# Patient Record
Sex: Male | Born: 1965 | Race: Black or African American | Hispanic: No | Marital: Single | Smoking: Never smoker
Health system: Southern US, Community
[De-identification: ages and names within clinical notes are randomized; demographics above are authoritative.]

## PROBLEM LIST (undated history)

## (undated) DIAGNOSIS — M2340 Loose body in knee, unspecified knee: Secondary | ICD-10-CM

## (undated) HISTORY — PX: OTHER SURGICAL HISTORY: SHX169

---

## 1998-12-01 ENCOUNTER — Emergency Department (HOSPITAL_COMMUNITY): Admission: EM | Admit: 1998-12-01 | Discharge: 1998-12-01 | Payer: Self-pay | Admitting: Emergency Medicine

## 1998-12-03 ENCOUNTER — Emergency Department (HOSPITAL_COMMUNITY): Admission: EM | Admit: 1998-12-03 | Discharge: 1998-12-03 | Payer: Self-pay | Admitting: *Deleted

## 2000-08-19 ENCOUNTER — Encounter: Admission: RE | Admit: 2000-08-19 | Discharge: 2000-08-19 | Payer: Self-pay | Admitting: General Practice

## 2000-08-19 ENCOUNTER — Encounter: Payer: Self-pay | Admitting: General Practice

## 2000-08-29 ENCOUNTER — Ambulatory Visit (HOSPITAL_BASED_OUTPATIENT_CLINIC_OR_DEPARTMENT_OTHER): Admission: RE | Admit: 2000-08-29 | Discharge: 2000-08-29 | Payer: Self-pay | Admitting: Ophthalmology

## 2001-05-28 ENCOUNTER — Encounter: Payer: Self-pay | Admitting: *Deleted

## 2001-05-28 ENCOUNTER — Emergency Department (HOSPITAL_COMMUNITY): Admission: EM | Admit: 2001-05-28 | Discharge: 2001-05-28 | Payer: Self-pay

## 2001-05-28 ENCOUNTER — Encounter: Payer: Self-pay | Admitting: Emergency Medicine

## 2003-01-25 ENCOUNTER — Encounter: Admission: RE | Admit: 2003-01-25 | Discharge: 2003-01-25 | Payer: Self-pay | Admitting: Specialist

## 2003-06-17 ENCOUNTER — Ambulatory Visit (HOSPITAL_COMMUNITY): Admission: RE | Admit: 2003-06-17 | Discharge: 2003-06-17 | Payer: Self-pay | Admitting: Ophthalmology

## 2005-06-15 ENCOUNTER — Ambulatory Visit (HOSPITAL_COMMUNITY): Admission: RE | Admit: 2005-06-15 | Discharge: 2005-06-15 | Payer: Self-pay | Admitting: Ophthalmology

## 2012-07-15 ENCOUNTER — Other Ambulatory Visit: Payer: Self-pay | Admitting: Orthopedic Surgery

## 2012-07-15 MED ORDER — DEXAMETHASONE SODIUM PHOSPHATE 10 MG/ML IJ SOLN: 10.0000 mg | Freq: Once | INTRAMUSCULAR | Status: DC

## 2012-07-15 NOTE — Progress Notes (Signed)
Preoperative surgical orders have been place into the [facility] hospital system for Kyrel [name] on 07/15/2012, 5:32 PM  by Patrica Duel for surgery on 07/28/2012.  Preop Knee Scope orders including IV Tylenol and IV Decadron as long as there are no contraindications to the above medications. Avel Peace, PA-C

## 2012-07-21 ENCOUNTER — Encounter (HOSPITAL_COMMUNITY): Payer: Self-pay | Admitting: Pharmacy Technician

## 2012-07-23 ENCOUNTER — Encounter (HOSPITAL_COMMUNITY): Payer: Self-pay

## 2012-07-23 ENCOUNTER — Encounter (HOSPITAL_COMMUNITY)
Admission: RE | Admit: 2012-07-23 | Discharge: 2012-07-23 | Disposition: A | Discharge status: Home / Self Care | Payer: BC Managed Care – PPO | Source: Ambulatory Visit | Attending: Orthopedic Surgery | Admitting: Orthopedic Surgery

## 2012-07-23 HISTORY — DX: Loose body in knee, unspecified knee: M23.40

## 2012-07-23 LAB — CBC
MCHC: 35.1 g/dL (ref 30.0–36.0)
Platelets: 250 10*3/uL (ref 150–400)
RDW: 13 % (ref 11.5–15.5)

## 2012-07-23 LAB — SURGICAL PCR SCREEN
MRSA, PCR: NEGATIVE
Staphylococcus aureus: NEGATIVE

## 2012-07-23 NOTE — Patient Instructions (Signed)
Javier Weaver  07/23/2012                           YOUR PROCEDURE IS SCHEDULED ON:  07/28/12               PLEASE REPORT TO SHORT STAY CENTER AT :  12:00 PM               CALL THIS NUMBER IF ANY PROBLEMS THE DAY OF SURGERY :               [phone]    NONE                  REMEMBER:   Do not eat food or drink liquids AFTER MIDNIGHT  May have clear liquids UNTIL 6 HOURS BEFORE SURGERY (9:00 AM)  Clear liquids include soda, tea, black coffee, apple or grape juice, broth.  Take these medicines the morning of surgery with A SIP OF WATER:  NONE   Do not wear jewelry, make-up   Do not wear lotions, powders, or perfumes.   Do not shave legs or underarms 12 hrs. before surgery (men may shave face)  Do not bring valuables to the hospital.  Contacts, dentures or bridgework may not be worn into surgery.  Leave suitcase in the car. After surgery it may be brought to your room.  For patients admitted to the hospital more than one night, checkout time is 11:00                          The day of discharge.   Patients discharged the day of surgery will not be allowed to drive home                             If going home same day of surgery, must have someone stay with you first                           24 hrs at home and arrange for some one to drive you home from hospital.    Special Instructions:   Please read over the following fact sheets that you were given:               1. MRSA  INFORMATION                      2. [place] PREPARING FOR SURGERY SHEET               3. INCENTIVE SPIROMETER                                                X_____________________________________________________________________        Failure to follow these instructions may result in cancellation of your surgery

## 2012-07-28 ENCOUNTER — Encounter (HOSPITAL_COMMUNITY)
Admission: RE | Disposition: A | Discharge status: Home / Self Care | Payer: Self-pay | Source: Ambulatory Visit | Attending: Orthopedic Surgery

## 2012-07-28 ENCOUNTER — Encounter (HOSPITAL_COMMUNITY): Payer: Self-pay | Admitting: Anesthesiology

## 2012-07-28 ENCOUNTER — Ambulatory Visit (HOSPITAL_COMMUNITY): Payer: BC Managed Care – PPO | Admitting: Anesthesiology

## 2012-07-28 ENCOUNTER — Encounter (HOSPITAL_COMMUNITY): Payer: Self-pay | Admitting: *Deleted

## 2012-07-28 ENCOUNTER — Ambulatory Visit (HOSPITAL_COMMUNITY)
Admission: RE | Admit: 2012-07-28 | Discharge: 2012-07-28 | Disposition: A | Discharge status: Home / Self Care | Payer: BC Managed Care – PPO | Source: Ambulatory Visit | Attending: Orthopedic Surgery | Admitting: Orthopedic Surgery

## 2012-07-28 DIAGNOSIS — M12569 Traumatic arthropathy, unspecified knee: Secondary | ICD-10-CM | POA: Insufficient documentation

## 2012-07-28 DIAGNOSIS — M2340 Loose body in knee, unspecified knee: Secondary | ICD-10-CM | POA: Insufficient documentation

## 2012-07-28 DIAGNOSIS — M1710 Unilateral primary osteoarthritis, unspecified knee: Secondary | ICD-10-CM

## 2012-07-28 DIAGNOSIS — Z01812 Encounter for preprocedural laboratory examination: Secondary | ICD-10-CM | POA: Insufficient documentation

## 2012-07-28 DIAGNOSIS — M179 Osteoarthritis of knee, unspecified: Secondary | ICD-10-CM | POA: Diagnosis present

## 2012-07-28 HISTORY — PX: KNEE ARTHROTOMY: SHX5881

## 2012-07-28 SURGERY — ARTHROTOMY, KNEE
Anesthesia: General | Site: Knee | Laterality: Left | Wound class: Clean

## 2012-07-28 MED ORDER — ACETAMINOPHEN 10 MG/ML IV SOLN: 1000.0000 mg | Freq: Once | INTRAVENOUS | Status: DC

## 2012-07-28 MED ORDER — LIDOCAINE HCL (CARDIAC) 20 MG/ML IV SOLN
INTRAVENOUS | Status: DC | PRN
  Administered 2012-07-28: 50 mg via INTRAVENOUS

## 2012-07-28 MED ORDER — OXYCODONE HCL 5 MG PO TABS: 5.0000 mg | ORAL_TABLET | ORAL | Status: DC | PRN

## 2012-07-28 MED ORDER — 0.9 % SODIUM CHLORIDE (POUR BTL) OPTIME
TOPICAL | Status: DC | PRN
  Administered 2012-07-28: 200 mL

## 2012-07-28 MED ORDER — ACETAMINOPHEN 10 MG/ML IV SOLN: 1000.0000 mg | Freq: Once | INTRAVENOUS | Status: DC | PRN

## 2012-07-28 MED ORDER — PROMETHAZINE HCL 25 MG/ML IJ SOLN: 6.2500 mg | INTRAMUSCULAR | Status: DC | PRN

## 2012-07-28 MED ORDER — OXYCODONE HCL 5 MG PO TABS
5.0000 mg | ORAL_TABLET | Freq: Once | ORAL | Status: AC | PRN
  Administered 2012-07-28: 5 mg via ORAL

## 2012-07-28 MED ORDER — HYDROMORPHONE HCL PF 1 MG/ML IJ SOLN
INTRAMUSCULAR | Status: AC
  Filled 2012-07-28: qty 1

## 2012-07-28 MED ORDER — HYDROMORPHONE HCL PF 1 MG/ML IJ SOLN
0.2500 mg | INTRAMUSCULAR | Status: DC | PRN
  Administered 2012-07-28 (×3): 0.5 mg via INTRAVENOUS

## 2012-07-28 MED ORDER — PROPOFOL 10 MG/ML IV BOLUS
INTRAVENOUS | Status: DC | PRN
  Administered 2012-07-28: 180 mg via INTRAVENOUS

## 2012-07-28 MED ORDER — BUPIVACAINE HCL (PF) 0.5 % IJ SOLN
INTRAMUSCULAR | Status: DC | PRN
  Administered 2012-07-28: 30 mL

## 2012-07-28 MED ORDER — MIDAZOLAM HCL 5 MG/5ML IJ SOLN
INTRAMUSCULAR | Status: DC | PRN
  Administered 2012-07-28: 2 mg via INTRAVENOUS

## 2012-07-28 MED ORDER — BUPIVACAINE HCL (PF) 0.25 % IJ SOLN
INTRAMUSCULAR | Status: AC
  Filled 2012-07-28: qty 30

## 2012-07-28 MED ORDER — ONDANSETRON HCL 4 MG/2ML IJ SOLN
INTRAMUSCULAR | Status: DC | PRN
  Administered 2012-07-28: 4 mg via INTRAVENOUS

## 2012-07-28 MED ORDER — OXYCODONE HCL 5 MG PO TABS
ORAL_TABLET | ORAL | Status: AC
  Filled 2012-07-28: qty 1

## 2012-07-28 MED ORDER — LACTATED RINGERS IV SOLN
INTRAVENOUS | Status: DC
  Administered 2012-07-28: 17:00:00 via INTRAVENOUS
  Administered 2012-07-28: 1000 mL via INTRAVENOUS

## 2012-07-28 MED ORDER — FENTANYL CITRATE 0.05 MG/ML IJ SOLN
INTRAMUSCULAR | Status: DC | PRN
  Administered 2012-07-28: 50 ug via INTRAVENOUS
  Administered 2012-07-28 (×2): 25 ug via INTRAVENOUS

## 2012-07-28 MED ORDER — MEPERIDINE HCL 50 MG/ML IJ SOLN: 6.2500 mg | INTRAMUSCULAR | Status: DC | PRN

## 2012-07-28 MED ORDER — SODIUM CHLORIDE 0.9 % IV SOLN: INTRAVENOUS | Status: DC

## 2012-07-28 MED ORDER — CEFAZOLIN SODIUM-DEXTROSE 2-3 GM-% IV SOLR
INTRAVENOUS | Status: AC
  Filled 2012-07-28: qty 50

## 2012-07-28 MED ORDER — OXYCODONE HCL 5 MG/5ML PO SOLN
5.0000 mg | Freq: Once | ORAL | Status: AC | PRN
  Filled 2012-07-28: qty 5

## 2012-07-28 MED ORDER — CEFAZOLIN SODIUM-DEXTROSE 2-3 GM-% IV SOLR
2.0000 g | INTRAVENOUS | Status: AC
  Administered 2012-07-28: 2 g via INTRAVENOUS

## 2012-07-28 MED ORDER — CHLORHEXIDINE GLUCONATE 4 % EX LIQD: 60.0000 mL | Freq: Once | CUTANEOUS | Status: DC

## 2012-07-28 SURGICAL SUPPLY — 37 items
BAG SPEC THK2 15X12 ZIP CLS (MISCELLANEOUS) ×1
BAG ZIPLOCK 12X15 (MISCELLANEOUS) ×2 IMPLANT
BANDAGE ELASTIC 6 VELCRO ST LF (GAUZE/BANDAGES/DRESSINGS) ×2 IMPLANT
BANDAGE ESMARK 6X9 LF (GAUZE/BANDAGES/DRESSINGS) ×1 IMPLANT
BANDAGE GAUZE ELAST BULKY 4 IN (GAUZE/BANDAGES/DRESSINGS) ×2 IMPLANT
BNDG CMPR 9X6 STRL LF SNTH (GAUZE/BANDAGES/DRESSINGS) ×1
BNDG ESMARK 6X9 LF (GAUZE/BANDAGES/DRESSINGS) ×2
CLOTH BEACON ORANGE TIMEOUT ST (SAFETY) ×2 IMPLANT
CUFF TOURN SGL QUICK 34 (TOURNIQUET CUFF) ×2
CUFF TRNQT CYL 34X4X40X1 (TOURNIQUET CUFF) IMPLANT
DRAPE EXTREMITY T 121X128X90 (DRAPE) ×2 IMPLANT
DRAPE U-SHAPE 47X51 STRL (DRAPES) ×2 IMPLANT
DRSG ADAPTIC 3X8 NADH LF (GAUZE/BANDAGES/DRESSINGS) ×2 IMPLANT
DRSG PAD ABDOMINAL 8X10 ST (GAUZE/BANDAGES/DRESSINGS) ×4 IMPLANT
DURAPREP 26ML APPLICATOR (WOUND CARE) ×2 IMPLANT
ELECT REM PT RETURN 9FT ADLT (ELECTROSURGICAL) ×2
ELECTRODE REM PT RTRN 9FT ADLT (ELECTROSURGICAL) ×1 IMPLANT
GLOVE BIO SURGEON STRL SZ7.5 (GLOVE) ×2 IMPLANT
GLOVE BIO SURGEON STRL SZ8 (GLOVE) ×2 IMPLANT
GLOVE BIOGEL PI IND STRL 8 (GLOVE) ×2 IMPLANT
GLOVE BIOGEL PI INDICATOR 8 (GLOVE) ×2
GOWN STRL NON-REIN LRG LVL3 (GOWN DISPOSABLE) ×2 IMPLANT
GOWN STRL REIN XL XLG (GOWN DISPOSABLE) ×2 IMPLANT
KIT BASIN OR (CUSTOM PROCEDURE TRAY) ×2 IMPLANT
MANIFOLD NEPTUNE II (INSTRUMENTS) ×2 IMPLANT
PACK TOTAL JOINT (CUSTOM PROCEDURE TRAY) ×2 IMPLANT
PAD CAST 4YDX4 CTTN HI CHSV (CAST SUPPLIES) ×1 IMPLANT
PADDING CAST COTTON 4X4 STRL (CAST SUPPLIES) ×2
PADDING CAST COTTON 6X4 STRL (CAST SUPPLIES) ×1 IMPLANT
POSITIONER SURGICAL ARM (MISCELLANEOUS) ×2 IMPLANT
SPONGE GAUZE 4X4 12PLY (GAUZE/BANDAGES/DRESSINGS) ×2 IMPLANT
SUT MNCRL AB 4-0 PS2 18 (SUTURE) ×2 IMPLANT
SUT PDS AB 1 CT1 27 (SUTURE) ×1 IMPLANT
SUT VIC AB 2-0 CT1 27 (SUTURE) ×4
SUT VIC AB 2-0 CT1 TAPERPNT 27 (SUTURE) ×2 IMPLANT
SUT VLOC 180 0 24IN GS25 (SUTURE) ×1 IMPLANT
TOWEL OR 17X26 10 PK STRL BLUE (TOWEL DISPOSABLE) ×4 IMPLANT

## 2012-07-28 NOTE — Transfer of Care (Signed)
Immediate Anesthesia Transfer of Care Note  Patient: Javier Weaver  Procedure(s) Performed: Procedure(s): LEFT KNEE ARTHROTOMY WITH REMOVAL OF LOOSE BODIES  (Left)  Patient Location: PACU  Anesthesia Type:General  Level of Consciousness: awake, alert  and oriented  Airway & Oxygen Therapy: Patient Spontanous Breathing and Patient connected to face mask oxygen  Post-op Assessment: Report given to PACU RN and Post -op Vital signs reviewed and stable  Post vital signs: Reviewed and stable  Complications: No apparent anesthesia complications

## 2012-07-28 NOTE — Anesthesia Postprocedure Evaluation (Signed)
Anesthesia Post Note  Patient: Javier Weaver  Procedure(s) Performed: Procedure(s) (LRB): LEFT KNEE ARTHROTOMY WITH REMOVAL OF LOOSE BODIES  (Left)  Anesthesia type: General  Patient location: PACU  Post pain: Pain level controlled  Post assessment: Post-op Vital signs reviewed  Last Vitals: BP 121/70  Pulse 48  Temp(Src) 36.4 C (Oral)  Resp 17  SpO2 100%  Post vital signs: Reviewed  Level of consciousness: sedated  Complications: No apparent anesthesia complications

## 2012-07-28 NOTE — Interval H&P Note (Signed)
History and Physical Interval Note:  07/28/2012 4:06 PM  Javier Weaver  has presented today for surgery, with the diagnosis of LOOSE BODIES OF LEFT KNEE   The various methods of treatment have been discussed with the patient and family. After consideration of risks, benefits and other options for treatment, the patient has consented to  Procedure(s): LEFT KNEE ARTHROTOMY WITH REMOVAL OF LOOSE BODIES  (Left) as a surgical intervention .  The patient's history has been reviewed, patient examined, no change in status, stable for surgery.  I have reviewed the patient's chart and labs.  Questions were answered to the patient's satisfaction.     Loanne Drilling

## 2012-07-28 NOTE — Anesthesia Postprocedure Evaluation (Signed)
Anesthesia Post Note  Patient: Javier Weaver  Procedure(s) Performed: Procedure(s) (LRB): LEFT KNEE ARTHROTOMY WITH REMOVAL OF LOOSE BODIES  (Left)  Anesthesia type: General  Patient location: PACU  Post pain: Pain level controlled  Post assessment: Post-op Vital signs reviewed  Last Vitals: BP 121/70  Pulse 48  Temp(Src) 36.4 C (Oral)  Resp 17  SpO2 100%  Post vital signs: Reviewed  Level of consciousness: sedated  Complications: No apparent anesthesia complications  

## 2012-07-28 NOTE — H&P (Signed)
  CC- Javier Weaver is a 47 y.o. male who presents with left knee pain.  HPI- . Knee Pain: Patient presents with knee pain involving the  left knee. Onset of the symptoms was several years ago. Inciting event: had soccer injuries many years ago with subsequent post-traumatic OA  and now a large loose body causing mechanical symptoms.. Current symptoms include crepitus sensation, locking, popping sensation and stiffness. Pain is aggravated by kneeling, pivoting, squatting and walking.  Patient has had prior knee problems. Evaluation to date: plain films: abnormal OA and large loose body. Treatment to date: none.  Past Medical History  Diagnosis Date  . Loose body in knee     LEFT    Past Surgical History  Procedure Laterality Date  . Cataracts      REMOVED    Prior to Admission medications   Medication Sig Start Date End Date Taking? Authorizing Provider  acetaminophen (TYLENOL) 500 MG tablet Take 500 mg by mouth every 6 (six) hours as needed for pain.    Historical Provider, MD  naproxen sodium (ANAPROX) 220 MG tablet Take 220 mg by mouth 2 (two) times daily as needed (for pain).     Historical Provider, MD   KNEE EXAM antalgic gait, no effusion, negative drawer sign, collateral ligaments intact, crepitus on ROM, palpable loose body  Physical Examination: General appearance - alert, well appearing, and in no distress Mental status - alert, oriented to person, place, and time Chest - clear to auscultation, no wheezes, rales or rhonchi, symmetric air entry Heart - normal rate, regular rhythm, normal S1, S2, no murmurs, rubs, clicks or gallops Abdomen - soft, nontender, nondistended, no masses or organomegaly Neurological - alert, oriented, normal speech, no focal findings or movement disorder noted   Asessment/Plan--- Left knee OA with large loose body - Plan left knee arthrotomy with loose body removal. Procedure risks and potential comps discussed with patient who elects to  proceed. Goals are decreased pain and increased function with a high likelihood of achieving both

## 2012-07-28 NOTE — Brief Op Note (Signed)
07/28/2012  5:33 PM  PATIENT:  Javier Weaver  47 y.o. male  PRE-OPERATIVE DIAGNOSIS:  LOOSE BODIES OF LEFT KNEE   POST-OPERATIVE DIAGNOSIS:  loose bodies of left knee  PROCEDURE:  Procedure(s): LEFT KNEE ARTHROTOMY WITH REMOVAL OF LOOSE BODIES  (Left)  SURGEON:  Surgeon(s) and Role:    * Loanne Drilling, MD - Primary  PHYSICIAN ASSISTANT:   ASSISTANTS: Avel Peace, PA-C   ANESTHESIA:   general  EBL:  Total I/O In: 1000 [I.V.:1000] Out: -   BLOOD ADMINISTERED:none  DRAINS: none   LOCAL MEDICATIONS USED:  MARCAINE     SPECIMEN:  No Specimen  DISPOSITION OF SPECIMEN:  N/A  COUNTS:  YES  TOURNIQUET:   Total Tourniquet Time Documented: Thigh (Left) - 11 minutes Total: Thigh (Left) - 11 minutes   DICTATION: .Other Dictation: Dictation Number [id number]  PLAN OF CARE: Discharge to home after PACU  PATIENT DISPOSITION:  PACU - hemodynamically stable.

## 2012-07-28 NOTE — Anesthesia Preprocedure Evaluation (Addendum)
Anesthesia Evaluation  Patient identified by MRN, date of birth, ID band Patient awake    Reviewed: Allergy & Precautions, H&P , NPO status , Patient's Chart, lab work & pertinent test results  Airway Mallampati: I TM Distance: >3 FB Neck ROM: Full    Dental  (+) Dental Advisory Given and Teeth Intact   Pulmonary neg pulmonary ROS,  breath sounds clear to auscultation  Pulmonary exam normal       Cardiovascular negative cardio ROS  Rhythm:Regular Rate:Normal     Neuro/Psych negative neurological ROS  negative psych ROS   GI/Hepatic negative GI ROS, Neg liver ROS,   Endo/Other  negative endocrine ROS  Renal/GU negative Renal ROS     Musculoskeletal negative musculoskeletal ROS (+)   Abdominal   Peds  Hematology negative hematology ROS (+)   Anesthesia Other Findings   Reproductive/Obstetrics                          Anesthesia Physical Anesthesia Plan  ASA: I  Anesthesia Plan: General   Post-op Pain Management:    Induction: Intravenous  Airway Management Planned: LMA  Additional Equipment:   Intra-op Plan:   Post-operative Plan: Extubation in OR  Informed Consent: I have reviewed the patients History and Physical, chart, labs and discussed the procedure including the risks, benefits and alternatives for the proposed anesthesia with the patient or authorized representative who has indicated his/her understanding and acceptance.   Dental advisory given  Plan Discussed with: CRNA  Anesthesia Plan Comments:         Anesthesia Quick Evaluation  

## 2012-07-29 ENCOUNTER — Encounter (HOSPITAL_COMMUNITY): Payer: Self-pay | Admitting: Orthopedic Surgery

## 2012-07-29 NOTE — Op Note (Signed)
NAMEAHSAN, ESTERLINE NO.:  [id number]  MEDICAL RECORD NO.:  [id number]  LOCATION:  [facility]                         FACILITY:  [facility]  PHYSICIAN:  Ollen Gross, M.D.    DATE OF BIRTH:  06/14/1965  DATE OF PROCEDURE:  07/28/2012 DATE OF DISCHARGE:  07/28/2012                              OPERATIVE REPORT   PREOPERATIVE DIAGNOSIS:  Left knee arthritis with large loose body.  POSTOPERATIVE DIAGNOSIS:  Left knee arthritis with large loose body.  PROCEDURE:  Left knee arthrotomy with removal of loose bodies.  SURGEON:  Ollen Gross, M.D.  ASSISTANT:  Alexzandrew L. Perkins, P.A.C.  ANESTHESIA:  General.  ESTIMATED BLOOD LOSS:  Minimal.  DRAINS:  None.  TOURNIQUET TIME:  10 minutes at 300 mmHg.  COMPLICATIONS:  None.  CONDITION:  Stable to recovery.  BRIEF CLINICAL NOTE:  Mr. 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  His left knee is painful, but he has significant mechanical symptoms of the knee locking up on him frequently.  He has a large loose body in the suprapatellar region, which migrates and catches occasionally locking up.  He presents today for arthrotomy with removal of his loose body.  PROCEDURE IN DETAIL:  After successful administration of general anesthetic, a tourniquet was placed high on the left thigh, and left lower extremity was prepped and draped in the usual sterile fashion. Extremities were wrapped in Esmarch.  Tourniquet was inflated to 300 mmHg.  A small arthrotomy was made in the midline, about 2 cm above the superior aspect of the patella coursing down to the superior pole of the patella.  The skin was cut with a 10 blade through the subcutaneous tissue to the level of the extensor mechanism.  A fresh blade was used to make a small arthrotomy.  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  It was removed  first and then a large calcified body, which was about 3 x 3 cm was removed.  I palpated into the joint.  There were several smaller loose bodies present in the synovium and I took the synovium and the small bodies out.  I kept palpating and removing more loose bodies.  All of them were tethered to the synovium as I removed the small fragments of synovium with them.  I then palpated through the suprapatellar pouch until there were no calcified bodies remaining.  We then thoroughly irrigated the joint with saline.  Tourniquet was released after a total time 10 minutes.  We achieved meticulous hemostasis throughout the joint tissues and the extensor mechanism of the subcu tissue.  I then injected a total of 30 mL of 0.25% Marcaine into the extensor mechanism, subcu tissues and joint.  The arthrotomy was then closed with a #1 PDS suture.  Subcu closed with interrupted 2-0 Vicryl, and subcuticular running 4-0 Monocryl.  The incision was then cleaned and dried, and a bulky sterile dressing applied.  He was then awakened and transported to recovery in stable condition.     Ollen Gross, M.D.     FA/MEDQ  D:  07/28/2012  T:  07/29/2012  Job:  [id number]

## 2017-01-25 DIAGNOSIS — H2513 Age-related nuclear cataract, bilateral: Secondary | ICD-10-CM | POA: Diagnosis not present

## 2017-01-25 DIAGNOSIS — H25013 Cortical age-related cataract, bilateral: Secondary | ICD-10-CM | POA: Diagnosis not present

## 2017-03-08 ENCOUNTER — Emergency Department (HOSPITAL_COMMUNITY)
Admission: EM | Admit: 2017-03-08 | Discharge: 2017-03-08 | Disposition: A | Discharge status: Home / Self Care | Payer: 59 | Attending: Emergency Medicine | Admitting: Emergency Medicine

## 2017-03-08 ENCOUNTER — Encounter (HOSPITAL_COMMUNITY): Payer: Self-pay | Admitting: Emergency Medicine

## 2017-03-08 DIAGNOSIS — M79604 Pain in right leg: Secondary | ICD-10-CM

## 2017-03-08 DIAGNOSIS — Y9389 Activity, other specified: Secondary | ICD-10-CM | POA: Insufficient documentation

## 2017-03-08 DIAGNOSIS — M62838 Other muscle spasm: Secondary | ICD-10-CM | POA: Diagnosis not present

## 2017-03-08 DIAGNOSIS — S161XXA Strain of muscle, fascia and tendon at neck level, initial encounter: Secondary | ICD-10-CM

## 2017-03-08 DIAGNOSIS — Y92410 Unspecified street and highway as the place of occurrence of the external cause: Secondary | ICD-10-CM | POA: Insufficient documentation

## 2017-03-08 DIAGNOSIS — S199XXA Unspecified injury of neck, initial encounter: Secondary | ICD-10-CM | POA: Diagnosis present

## 2017-03-08 DIAGNOSIS — Y999 Unspecified external cause status: Secondary | ICD-10-CM | POA: Diagnosis not present

## 2017-03-08 MED ORDER — CYCLOBENZAPRINE HCL 10 MG PO TABS: 10.0000 mg | ORAL_TABLET | Freq: Three times a day (TID) | ORAL | 0 refills | Status: DC | PRN

## 2017-03-08 MED ORDER — NAPROXEN 500 MG PO TABS: 500.0000 mg | ORAL_TABLET | Freq: Two times a day (BID) | ORAL | 0 refills | Status: DC | PRN
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
## 2017-03-08 NOTE — Discharge Instructions (Signed)
Take naprosyn as directed for inflammation and pain with tylenol for breakthrough pain and flexeril for muscle relaxation. Do not drive or operate machinery with muscle relaxant use. Use heat to areas of soreness, no more than 20 minutes at a time every hour. Expect to be sore for the next few days and follow up with primary care physician for recheck of ongoing symptoms in the next 1 week. Return to ER for emergent changing or worsening of symptoms.
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
## 2017-04-26 DIAGNOSIS — J069 Acute upper respiratory infection, unspecified: Secondary | ICD-10-CM | POA: Diagnosis not present

## 2017-07-11 DIAGNOSIS — Z0000 Encounter for general adult medical examination without abnormal findings: Secondary | ICD-10-CM | POA: Diagnosis not present

## 2017-07-11 DIAGNOSIS — Z13220 Encounter for screening for lipoid disorders: Secondary | ICD-10-CM | POA: Diagnosis not present

## 2017-11-01 DIAGNOSIS — Z1211 Encounter for screening for malignant neoplasm of colon: Secondary | ICD-10-CM | POA: Diagnosis not present

## 2018-09-02 ENCOUNTER — Other Ambulatory Visit: Payer: Self-pay

## 2018-09-02 ENCOUNTER — Encounter (HOSPITAL_COMMUNITY): Payer: Self-pay | Admitting: Emergency Medicine

## 2018-09-02 ENCOUNTER — Emergency Department (HOSPITAL_COMMUNITY): Payer: Worker's Compensation

## 2018-09-02 ENCOUNTER — Emergency Department (HOSPITAL_COMMUNITY)
Admission: EM | Admit: 2018-09-02 | Discharge: 2018-09-02 | Disposition: A | Discharge status: Home / Self Care | Payer: Self-pay | Attending: Emergency Medicine | Admitting: Emergency Medicine

## 2018-09-02 ENCOUNTER — Emergency Department (HOSPITAL_BASED_OUTPATIENT_CLINIC_OR_DEPARTMENT_OTHER): Payer: Worker's Compensation

## 2018-09-02 DIAGNOSIS — R600 Localized edema: Secondary | ICD-10-CM | POA: Insufficient documentation

## 2018-09-02 DIAGNOSIS — W19XXXA Unspecified fall, initial encounter: Secondary | ICD-10-CM | POA: Insufficient documentation

## 2018-09-02 DIAGNOSIS — M7989 Other specified soft tissue disorders: Secondary | ICD-10-CM

## 2018-09-02 DIAGNOSIS — R52 Pain, unspecified: Secondary | ICD-10-CM

## 2018-09-02 DIAGNOSIS — R609 Edema, unspecified: Secondary | ICD-10-CM

## 2018-09-02 DIAGNOSIS — M25551 Pain in right hip: Secondary | ICD-10-CM | POA: Insufficient documentation

## 2018-09-02 MED ORDER — NAPROXEN 500 MG PO TABS: 500.0000 mg | ORAL_TABLET | Freq: Two times a day (BID) | ORAL | 0 refills | Status: DC

## 2018-09-02 NOTE — Progress Notes (Signed)
Right lower extremity venous duplex completed. Preliminary results in Chart review CV Proc. [facility], [place] 09/02/2018, 3:09 PM

## 2018-09-02 NOTE — ED Triage Notes (Signed)
Pt. Stated, Im coming from [employer] for rt. Hip pain and rt. Leg swelling.  ? DVT

## 2018-09-02 NOTE — ED Notes (Signed)
Patient verbalizes understanding of discharge instructions. Opportunity for questioning and answers were provided. Armband removed by staff, pt discharged from ED.  

## 2018-09-02 NOTE — Discharge Instructions (Addendum)
Naproxen as prescribed.  Rest for the next several days, then slowly begin reintroducing activity as tolerated.  Return to the emergency department if you develop any new and/or concerning symptoms.

## 2018-09-02 NOTE — ED Provider Notes (Signed)
[facility] EMERGENCY DEPARTMENT Provider Note   CSN: [id number] Arrival date & time: 09/02/18  1219     History   Chief Complaint Chief Complaint  Patient presents with  . Fall  . Hip Pain  . Leg Swelling    rt.    HPI Javier Weaver is a 53 y.o. male.     Patient is a 53 year old male with no significant past medical history.  He presents today for evaluation of pain in his right leg.  Patient states that he fell at work about 1 week ago and injured his right hip.  Since that time he has noticed swelling extending down his leg and pain in his right calf.  He denies any chest pain or difficulty breathing.  He denies any fevers or chills.  The history is provided by the patient.  Fall This is a new problem. Episode onset: 1 week ago. The problem occurs constantly. The problem has been gradually worsening. The symptoms are aggravated by walking. Nothing relieves the symptoms. He has tried nothing for the symptoms. The treatment provided no relief.    Past Medical History:  Diagnosis Date  . Loose body in knee    LEFT    Patient Active Problem List   Diagnosis Date Noted  . OA (osteoarthritis) of knee 07/28/2012    Past Surgical History:  Procedure Laterality Date  . CATARACTS     REMOVED  . KNEE ARTHROTOMY Left 07/28/2012   Procedure: LEFT KNEE ARTHROTOMY WITH REMOVAL OF LOOSE BODIES ;  Surgeon: Gearlean Alf, MD;  Location: [facility];  Service: Orthopedics;  Laterality: Left;        Home Medications    Prior to Admission medications   Medication Sig Start Date End Date Taking? Authorizing Provider  acetaminophen (TYLENOL) 500 MG tablet Take 500 mg by mouth every 6 (six) hours as needed for pain.    [provider]  cyclobenzaprine (FLEXERIL) 10 MG tablet Take 1 tablet (10 mg total) by mouth 3 (three) times daily as needed for muscle spasms. 03/08/17   Street, Jemison, PA-C  naproxen (NAPROSYN) 500 MG tablet Take 1 tablet (500 mg  total) by mouth 2 (two) times daily as needed for mild pain, moderate pain or headache (TAKE WITH MEALS.). 03/08/17   Street, Mercedes, PA-C  naproxen sodium (ANAPROX) 220 MG tablet Take 220 mg by mouth 2 (two) times daily as needed (for pain).     [provider]  oxyCODONE (ROXICODONE) 5 MG immediate release tablet Take 1-2 tablets (5-10 mg total) by mouth every 4 (four) hours as needed for pain. 07/28/12   Aluisio, Pilar Plate, MD    Family History No family history on file.  Social History Social History   Tobacco Use  . Smoking status: Never Smoker  . Smokeless tobacco: Never Used  Substance Use Topics  . Alcohol use: Yes    Comment: OCCASIONAL  . Drug use: No     Allergies   Patient has no known allergies.   Review of Systems Review of Systems  All other systems reviewed and are negative.    Physical Exam Updated Vital Signs BP (!) 141/92 (BP Location: Right Arm)   Pulse 72   Temp 98.5 F (36.9 C) (Oral)   Resp 16   SpO2 100%   Physical Exam Vitals signs and nursing note reviewed.  Constitutional:      Appearance: Normal appearance.  HENT:     Head: Normocephalic and atraumatic.  Pulmonary:  Effort: Pulmonary effort is normal.  Musculoskeletal:     Comments: The right hip appears grossly normal.  There is mild tenderness to the lateral aspect.  Patient does have 1-2+ pitting edema of the right lower leg in a stocking distribution.  There is tenderness to the right calf.  DP pulses, motor, and sensation are all intact.  Skin:    General: Skin is warm and dry.  Neurological:     General: No focal deficit present.     Mental Status: He is alert.      ED Treatments / Results  Labs (all labs ordered are listed, but only abnormal results are displayed) Labs Reviewed - No data to display  EKG None  Radiology No results found.  Procedures Procedures (including critical care time)  Medications Ordered in ED Medications - No data to display    Initial Impression / Assessment and Plan / ED Course  I have reviewed the triage vital signs and the nursing notes.  Pertinent labs & imaging results that were available during my care of the patient were reviewed by me and considered in my medical decision making (see chart for details).  Patient presenting with complaints of right hip pain.  He fell approximately 1 week ago at work.  He is having swelling now to his lower leg.  On exam, he does have some edema to the right lower extremity, however DVT study is negative.  X-rays of the hip and knee show only degenerative changes.  At this point, I feel as though the patient is appropriate for discharge.  He will be started on an anti-inflammatory medication, advised to rest, and follow-up as needed if not improving in the next week.  Final Clinical Impressions(s) / ED Diagnoses   Final diagnoses:  None    ED Discharge Orders    None       Geoffery Lyonselo, Ziah Turvey, MD 09/02/18 [phone]

## 2019-05-23 ENCOUNTER — Ambulatory Visit: Payer: Self-pay | Attending: Internal Medicine

## 2019-05-23 DIAGNOSIS — Z23 Encounter for immunization: Secondary | ICD-10-CM

## 2019-05-23 NOTE — Progress Notes (Signed)
   Covid-19 Vaccination Clinic  Name:  Javier Weaver    MRN: [id number] DOB: Jul 05, 1965  05/23/2019  Javier Weaver was observed post Covid-19 immunization for 15 minutes without incident. He was provided with Vaccine Information Sheet and instruction to access the V-Safe system.   Javier Weaver was instructed to call 911 with any severe reactions post vaccine: Marland Kitchen Difficulty breathing  . Swelling of face and throat  . A fast heartbeat  . A bad rash all over body  . Dizziness and weakness   Immunizations Administered    Name Date Dose VIS Date Route   Pfizer COVID-19 Vaccine 05/23/2019  2:13 PM 0.3 mL 01/30/2019 Intramuscular   Manufacturer: [employer], [employer]   Lot: [id number]   NDC: [address]-9

## 2019-06-17 ENCOUNTER — Ambulatory Visit: Payer: Self-pay | Attending: Internal Medicine

## 2019-06-17 DIAGNOSIS — Z23 Encounter for immunization: Secondary | ICD-10-CM

## 2019-06-17 NOTE — Progress Notes (Signed)
   Covid-19 Vaccination Clinic  Name:  Javier Weaver    MRN: [id number] DOB: May 24, 1965  06/17/2019  Javier Weaver was observed post Covid-19 immunization for 15 minutes without incident. He was provided with Vaccine Information Sheet and instruction to access the V-Safe system.   Javier Weaver was instructed to call 911 with any severe reactions post vaccine: Marland Kitchen Difficulty breathing  . Swelling of face and throat  . A fast heartbeat  . A bad rash all over body  . Dizziness and weakness   Immunizations Administered    Name Date Dose VIS Date Route   Pfizer COVID-19 Vaccine 06/17/2019  3:36 PM 0.3 mL 04/15/2018 Intramuscular   Manufacturer: [employer], [employer]   Lot: [id number]   NDC: [address]-1

## 2019-11-12 ENCOUNTER — Other Ambulatory Visit: Payer: Self-pay | Admitting: Gastroenterology

## 2019-11-12 DIAGNOSIS — B181 Chronic viral hepatitis B without delta-agent: Secondary | ICD-10-CM

## 2019-11-13 ENCOUNTER — Other Ambulatory Visit: Payer: Self-pay | Admitting: Gastroenterology

## 2019-11-13 DIAGNOSIS — B181 Chronic viral hepatitis B without delta-agent: Secondary | ICD-10-CM

## 2019-11-19 ENCOUNTER — Ambulatory Visit
Admission: RE | Admit: 2019-11-19 | Discharge: 2019-11-19 | Disposition: A | Discharge status: Home / Self Care | Payer: Commercial Managed Care - PPO | Source: Ambulatory Visit | Attending: Gastroenterology | Admitting: Gastroenterology

## 2019-11-19 DIAGNOSIS — B181 Chronic viral hepatitis B without delta-agent: Secondary | ICD-10-CM

## 2019-12-01 ENCOUNTER — Other Ambulatory Visit (HOSPITAL_COMMUNITY): Payer: Self-pay | Admitting: Gastroenterology

## 2019-12-01 DIAGNOSIS — B181 Chronic viral hepatitis B without delta-agent: Secondary | ICD-10-CM

## 2019-12-10 ENCOUNTER — Other Ambulatory Visit: Payer: Self-pay | Admitting: Radiology

## 2019-12-14 ENCOUNTER — Other Ambulatory Visit: Payer: Self-pay

## 2019-12-14 ENCOUNTER — Ambulatory Visit (HOSPITAL_COMMUNITY)
Admission: RE | Admit: 2019-12-14 | Discharge: 2019-12-14 | Disposition: A | Discharge status: Home / Self Care | Payer: Commercial Managed Care - PPO | Source: Ambulatory Visit | Attending: Gastroenterology | Admitting: Gastroenterology

## 2019-12-14 DIAGNOSIS — R748 Abnormal levels of other serum enzymes: Secondary | ICD-10-CM | POA: Diagnosis not present

## 2019-12-14 DIAGNOSIS — B181 Chronic viral hepatitis B without delta-agent: Secondary | ICD-10-CM | POA: Diagnosis not present

## 2019-12-14 LAB — CBC
HCT: 44.7 % (ref 39.0–52.0)
Hemoglobin: 14.9 g/dL (ref 13.0–17.0)
MCH: 30.4 pg (ref 26.0–34.0)
MCHC: 33.3 g/dL (ref 30.0–36.0)
MCV: 91.2 fL (ref 80.0–100.0)
Platelets: 370 10*3/uL (ref 150–400)
RBC: 4.9 MIL/uL (ref 4.22–5.81)
RDW: 13.2 % (ref 11.5–15.5)
WBC: 4 10*3/uL (ref 4.0–10.5)
nRBC: 0 % (ref 0.0–0.2)

## 2019-12-14 LAB — PROTIME-INR
INR: 1.2 (ref 0.8–1.2)
Prothrombin Time: 14.3 seconds (ref 11.4–15.2)

## 2019-12-14 MED ORDER — FENTANYL CITRATE (PF) 100 MCG/2ML IJ SOLN
INTRAMUSCULAR | Status: AC | PRN
Start: 2019-12-14 — End: 2019-12-14
  Administered 2019-12-14: 50 ug via INTRAVENOUS

## 2019-12-14 MED ORDER — LIDOCAINE HCL (PF) 1 % IJ SOLN
INTRAMUSCULAR | Status: AC
  Filled 2019-12-14: qty 30

## 2019-12-14 MED ORDER — SODIUM CHLORIDE 0.9 % IV SOLN: INTRAVENOUS | Status: DC

## 2019-12-14 MED ORDER — FENTANYL CITRATE (PF) 100 MCG/2ML IJ SOLN
INTRAMUSCULAR | Status: AC
  Filled 2019-12-14: qty 2

## 2019-12-14 MED ORDER — OXYCODONE HCL 5 MG PO TABS: 5.0000 mg | ORAL_TABLET | ORAL | Status: DC | PRN

## 2019-12-14 MED ORDER — MIDAZOLAM HCL 2 MG/2ML IJ SOLN
INTRAMUSCULAR | Status: AC
  Filled 2019-12-14: qty 2

## 2019-12-14 MED ORDER — MIDAZOLAM HCL 2 MG/2ML IJ SOLN
INTRAMUSCULAR | Status: AC | PRN
  Administered 2019-12-14: 1 mg via INTRAVENOUS

## 2019-12-14 NOTE — Discharge Instructions (Signed)
TouReturn to Work _____[name] , [name]______________________________________________ was treated at our facility. Injury or illness was: ___Work-related. _X__Not work-related. ___Undetermined if work-related. Return to work  Employee may return to work on ___October 28, 2021___________________.  Employee may return to modified work on ______________________. Work activity restrictions This person is not able to do the following activities: ___Bend ___Sit for a prolonged time  This person should not sit for more than ____ hours at a time.  This person should not sit for more than ____ hours during an 8-hour workday. X___Lift more than __10______ lb ___Squat ___ Stand for a prolonged time  ___ This person should not stand for more than ____ hours at a time.  ___ This person should not stand for more than ____ hours during an 8-hour workday. ___Climb ___Reach ___Push and pull with the ___ right hand ___ left hand ___ Walk  ___ This person should not walk for more than ____ hours at a time.  ___ This person should not walk for more than ____ hours during an 8-hour workday. ___ Drive or operate a motor vehicle at work ___ [employer] with the ___ right hand ___ left hand ___Other _________________________________________________________________ These restrictions are effective until ______________________ or until a recheck appointment on ______________________. Health care provider name (printed): _________________________________________ Health care provider (signature): _________________________________________ Date: _________________________________________ How to use this form Show this Return to Work statement to your supervisor at work as soon as possible. Your employer should be aware of your condition and may be able to help with the necessary work activity restrictions. Contact your health care provider if:  You wish to return to work sooner than the date that is  listed above.  You have problems that make it difficult for you to return at that time. This information is not intended to replace advice given to you by your health care provider. Make sure you discuss any questions you have with your health care provider. Document Revised: 01/31/2017 Document Reviewed: 01/31/2017 Elsevier Patient Education  2020 [employer] Liver Biopsy, Care After These instructions give you information about how to care for yourself after your procedure. Your health care provider may also give you more specific instructions. If you have problems or questions, contact your health care provider. What can I expect after the procedure? After your procedure, it is common to have:  Pain and soreness in the area where the biopsy was done.  Bruising around the area where the biopsy was done.  Sleepiness and fatigue for 1-2 days. Follow these instructions at home: Medicines  Take over-the-counter and prescription medicines only as told by your health care provider.  If you were prescribed an antibiotic medicine, take it as told by your health care provider. Do not stop taking the antibiotic even if you start to feel better.  Do not take medicines such as aspirin and ibuprofen unless your health care provider tells you to take them. These medicines thin your blood and can increase the risk of bleeding.  If you are taking prescription pain medicine, take actions to prevent or treat constipation. Your health care provider may recommend that you: ? Drink enough fluid to keep your urine pale yellow. ? Eat foods that are high in fiber, such as fresh fruits and vegetables, whole grains, and beans. ? Limit foods that are high in fat and processed sugars, such as fried or sweet foods. ? Take an over-the-counter or prescription medicine for constipation. Incision care  Follow instructions from your health care provider about  how to take care of your incision. Make sure  you: ? Wash your hands with soap and water before you change your bandage (dressing). If soap and water are not available, use hand sanitizer. ? Change your dressing as told by your health care provider. ? Leave stitches (sutures), skin glue, or adhesive strips in place. These skin closures may need to stay in place for 2 weeks or longer. If adhesive strip edges start to loosen and curl up, you may trim the loose edges. Do not remove adhesive strips completely unless your health care provider tells you to do that.  Check your incision area every day for signs of infection. Check for: ? Redness, swelling, or pain. ? Fluid or blood. ? Warmth. ? Pus or a bad smell.  Do not take baths, swim, or use a hot tub until your health care provider says it is okay to do so. Activity   Rest at home for 1-2 days, or as directed by your health care provider. ? Avoid sitting for a long time without moving. Get up to take short walks every 1-2 hours. This is important to improve blood flow and breathing. Ask for help if you feel weak or unsteady.  Return to your normal activities as told by your health care provider. Ask your health care provider what activities are safe for you.  Do not drive or use heavy machinery while taking prescription pain medicine.  Do not lift anything that is heavier than 10 lb (4.5 kg), or the limit that your health care provider tells you, until he or she says that it is safe.  Do not play contact sports for 2 weeks after the procedure. General instructions   Do not drink alcohol in the first week after the procedure.  Have someone stay with you for at least 24 hours after the procedure.  It is your responsibility to obtain your test results. Ask your health care provider, or the department that is doing the test: ? When will my results be ready? ? How will I get my results? ? What are my treatment options? ? What other tests do I need? ? What are my next  steps?  Keep all follow-up visits as told by your health care provider. This is important. Contact a health care provider if:  You have increased bleeding from an incision, resulting in more than a small spot of blood.  You have redness, swelling, or increasing pain in any incisions.  You notice a discharge or a bad smell coming from any of your incisions.  You have a fever or chills. Get help right away if:  You develop swelling, bloating, or pain in your abdomen.  You become dizzy or faint.  You develop a rash.  You have nausea or you vomit.  You faint, or you have shortness of breath or difficulty breathing.  You develop chest pain.  You have problems with your speech or vision.  You have trouble with your balance or moving your arms or legs. Summary  After the liver biopsy, it is common to have pain, soreness, and bruising in the area, as well as sleepiness and fatigue.  Take over-the-counter and prescription medicines only as told by your health care provider.  Follow instructions from your health care provider about how to care for your incision. Check the incision area daily for signs of infection. This information is not intended to replace advice given to you by your health care provider. Make sure you  discuss any questions you have with your health care provider. Document Revised: 03/31/2018 Document Reviewed: 02/15/2017 Elsevier Patient Education  2020 [employer].

## 2019-12-14 NOTE — Procedures (Signed)
Interventional Radiology Procedure:   Indications: Hepatitis B and elevated liver enzymes  Procedure:  US guided liver biopsy  Findings: 3 cores from right hepatic lobe  Complications: None     EBL: less than 10 ml  Plan: Bedrest 3 hours   Massai Hankerson R. Lowella Dandy, MD  Pager: [phone]

## 2019-12-14 NOTE — H&P (Addendum)
Chief Complaint: Patient was seen in consultation today for random liver biopsy at the request of Kerin Salen  Referring Physician(s): Kerin Salen  Supervising Physician: Richarda Overlie  Patient Status: [facility] - Out-pt  History of Present Illness: Javier Weaver is a 54 y.o. male   Pt unaware of Hep B diagnosis until just 1 mo ago Was seeing MD for routine PE and blood work Was referred to Dr Marca Ancona with elevated liver enzymes Chronic Hep B Denies N/V Denies abd pain Denies jaundice  Scheduled now for random liver biopsy    Past Medical History:  Diagnosis Date  . Loose body in knee    LEFT    Past Surgical History:  Procedure Laterality Date  . CATARACTS     REMOVED  . KNEE ARTHROTOMY Left 07/28/2012   Procedure: LEFT KNEE ARTHROTOMY WITH REMOVAL OF LOOSE BODIES ;  Surgeon: Loanne Drilling, MD;  Location: [facility];  Service: Orthopedics;  Laterality: Left;    Allergies: Patient has no known allergies.  Medications: Prior to Admission medications   Medication Sig Start Date End Date Taking? Authorizing Provider  gabapentin (NEURONTIN) 300 MG capsule Take 300 mg by mouth daily as needed for pain. 11/13/19  Yes [provider]  meloxicam (MOBIC) 15 MG tablet Take 15 mg by mouth daily as needed for pain. 11/13/19  Yes [provider]     No family history on file.  Social History   Socioeconomic History  . Marital status: Single    Spouse name: Not on file  . Number of children: Not on file  . Years of education: Not on file  . Highest education level: Not on file  Occupational History  . Not on file  Tobacco Use  . Smoking status: Never Smoker  . Smokeless tobacco: Never Used  Substance and Sexual Activity  . Alcohol use: Yes    Comment: OCCASIONAL  . Drug use: No  . Sexual activity: Not on file  Other Topics Concern  . Not on file  Social History Narrative  . Not on file   Social Determinants of Health   Financial Resource Strain:    . Difficulty of Paying Living Expenses: Not on file  Food Insecurity:   . Worried About Programme researcher, broadcasting/film/video in the Last Year: Not on file  . Ran Out of Food in the Last Year: Not on file  Transportation Needs:   . Lack of Transportation (Medical): Not on file  . Lack of Transportation (Non-Medical): Not on file  Physical Activity:   . Days of Exercise per Week: Not on file  . Minutes of Exercise per Session: Not on file  Stress:   . Feeling of Stress : Not on file  Social Connections:   . Frequency of Communication with Friends and Family: Not on file  . Frequency of Social Gatherings with Friends and Family: Not on file  . Attends Religious Services: Not on file  . Active Member of Clubs or Organizations: Not on file  . Attends Banker Meetings: Not on file  . Marital Status: Not on file    Review of Systems: A 12 point ROS discussed and pertinent positives are indicated in the HPI above.  All other systems are negative.  Review of Systems  Constitutional: Negative for activity change, fatigue and fever.  Respiratory: Negative for cough and shortness of breath.   Gastrointestinal: Negative for abdominal pain, nausea and vomiting.  Psychiatric/Behavioral: Negative for behavioral problems and confusion.  Vital Signs: BP (!) 150/93   Pulse 61   Temp 98.1 F (36.7 C) (Oral)   Ht 5\' 7"  (1.702 m)   Wt 167 lb 8.8 oz (76 kg)   SpO2 100%   BMI 26.24 kg/m   Physical Exam Vitals reviewed.  Cardiovascular:     Rate and Rhythm: Normal rate and regular rhythm.     Heart sounds: Normal heart sounds.  Pulmonary:     Effort: Pulmonary effort is normal.     Breath sounds: Normal breath sounds.  Abdominal:     Palpations: Abdomen is soft.     Tenderness: There is no abdominal tenderness.  Musculoskeletal:        General: Normal range of motion.  Skin:    General: Skin is warm.  Neurological:     Mental Status: He is alert and oriented to person, place, and  time.  Psychiatric:        Behavior: Behavior normal.        Thought Content: Thought content normal.        Judgment: Judgment normal.     Imaging: ABDOMEN LIMITED RUQ  Result Date: 11/20/2019 CLINICAL DATA:  Hepatitis-B. EXAM: ULTRASOUND ABDOMEN LIMITED RIGHT UPPER QUADRANT COMPARISON:  None. FINDINGS: Gallbladder: No gallstones or wall thickening visualized. No sonographic Murphy sign noted by sonographer. Common bile duct: Diameter: 4 mm, within normal limits. Liver: No focal lesion identified. Within normal limits in parenchymal echogenicity. Portal vein is patent on color Doppler imaging with normal direction of blood flow towards the liver. Other: None. IMPRESSION: Normal exam. Electronically Signed   By: 01/20/2020 M.D.   On: 11/20/2019 14:17    Labs:  CBC: No results for input(s): WBC, HGB, HCT, PLT in the last 8760 hours.  COAGS: No results for input(s): INR, APTT in the last 8760 hours.  BMP: No results for input(s): NA, K, CL, CO2, GLUCOSE, BUN, CALCIUM, CREATININE, GFRNONAA, GFRAA in the last 8760 hours.  Invalid input(s): CMP  LIVER FUNCTION TESTS: No results for input(s): BILITOT, AST, ALT, ALKPHOS, PROT, ALBUMIN in the last 8760 hours.  TUMOR MARKERS: No results for input(s): AFPTM, CEA, CA199, CHROMGRNA in the last 8760 hours.  Assessment and Plan:  Chronic Hep B; abnormal blood work For liver core biopsy Risks and benefits of random liver biopsy was discussed with the patient and/or patient's family including, but not limited to bleeding, infection, damage to adjacent structures or low yield requiring additional tests.  All of the questions were answered and there is agreement to proceed.  Consent signed and in chart.   Thank you for this interesting consult.  I greatly enjoyed meeting Aryan [name] and look forward to participating in their care.  A copy of this report was sent to the requesting provider on this date.  Electronically  Signed: 01/20/2020, PA-C 12/14/2019, 11:42 AM   I spent a total of  30 Minutes   in face to face in clinical consultation, greater than 50% of which was counseling/coordinating care for random liver bx

## 2019-12-15 ENCOUNTER — Other Ambulatory Visit: Payer: Self-pay | Admitting: Physician Assistant

## 2019-12-15 LAB — SURGICAL PATHOLOGY

## 2020-11-09 DIAGNOSIS — Z125 Encounter for screening for malignant neoplasm of prostate: Secondary | ICD-10-CM | POA: Diagnosis not present

## 2020-11-09 DIAGNOSIS — E559 Vitamin D deficiency, unspecified: Secondary | ICD-10-CM | POA: Diagnosis not present

## 2020-11-09 DIAGNOSIS — Z13220 Encounter for screening for lipoid disorders: Secondary | ICD-10-CM | POA: Diagnosis not present

## 2020-11-10 DIAGNOSIS — Z0000 Encounter for general adult medical examination without abnormal findings: Secondary | ICD-10-CM | POA: Diagnosis not present

## 2020-11-24 ENCOUNTER — Ambulatory Visit
Admission: RE | Admit: 2020-11-24 | Discharge: 2020-11-24 | Disposition: A | Discharge status: Home / Self Care | Payer: BC Managed Care – PPO | Source: Ambulatory Visit | Attending: Sports Medicine | Admitting: Sports Medicine

## 2020-11-24 ENCOUNTER — Other Ambulatory Visit: Payer: Self-pay | Admitting: Sports Medicine

## 2020-11-24 DIAGNOSIS — M25561 Pain in right knee: Secondary | ICD-10-CM | POA: Diagnosis not present

## 2020-11-24 DIAGNOSIS — M1712 Unilateral primary osteoarthritis, left knee: Secondary | ICD-10-CM | POA: Diagnosis not present

## 2020-11-24 DIAGNOSIS — M1711 Unilateral primary osteoarthritis, right knee: Secondary | ICD-10-CM | POA: Diagnosis not present

## 2020-11-24 DIAGNOSIS — M25562 Pain in left knee: Secondary | ICD-10-CM | POA: Diagnosis not present

## 2020-12-23 DIAGNOSIS — B181 Chronic viral hepatitis B without delta-agent: Secondary | ICD-10-CM | POA: Diagnosis not present

## 2021-01-06 DIAGNOSIS — B181 Chronic viral hepatitis B without delta-agent: Secondary | ICD-10-CM | POA: Diagnosis not present

## 2021-01-06 DIAGNOSIS — B1910 Unspecified viral hepatitis B without hepatic coma: Secondary | ICD-10-CM | POA: Diagnosis not present

## 2021-02-27 DIAGNOSIS — H5203 Hypermetropia, bilateral: Secondary | ICD-10-CM | POA: Diagnosis not present

## 2021-02-27 DIAGNOSIS — H11003 Unspecified pterygium of eye, bilateral: Secondary | ICD-10-CM | POA: Diagnosis not present

## 2021-02-27 DIAGNOSIS — H2513 Age-related nuclear cataract, bilateral: Secondary | ICD-10-CM | POA: Diagnosis not present

## 2021-02-27 DIAGNOSIS — H25013 Cortical age-related cataract, bilateral: Secondary | ICD-10-CM | POA: Diagnosis not present

## 2021-05-02 DIAGNOSIS — B181 Chronic viral hepatitis B without delta-agent: Secondary | ICD-10-CM | POA: Diagnosis not present

## 2021-06-26 ENCOUNTER — Other Ambulatory Visit: Payer: Self-pay | Admitting: Gastroenterology

## 2021-06-26 DIAGNOSIS — B1910 Unspecified viral hepatitis B without hepatic coma: Secondary | ICD-10-CM

## 2021-06-28 ENCOUNTER — Ambulatory Visit
Admission: RE | Admit: 2021-06-28 | Discharge: 2021-06-28 | Disposition: A | Discharge status: Home / Self Care | Payer: BC Managed Care – PPO | Source: Ambulatory Visit | Attending: Gastroenterology | Admitting: Gastroenterology

## 2021-06-28 DIAGNOSIS — B1910 Unspecified viral hepatitis B without hepatic coma: Secondary | ICD-10-CM | POA: Diagnosis not present

## 2021-07-28 DIAGNOSIS — B181 Chronic viral hepatitis B without delta-agent: Secondary | ICD-10-CM | POA: Diagnosis not present

## 2021-07-31 DIAGNOSIS — M170 Bilateral primary osteoarthritis of knee: Secondary | ICD-10-CM | POA: Diagnosis not present

## 2021-11-23 DIAGNOSIS — Z13220 Encounter for screening for lipoid disorders: Secondary | ICD-10-CM | POA: Diagnosis not present

## 2021-11-23 DIAGNOSIS — Z125 Encounter for screening for malignant neoplasm of prostate: Secondary | ICD-10-CM | POA: Diagnosis not present

## 2021-11-23 DIAGNOSIS — Z0000 Encounter for general adult medical examination without abnormal findings: Secondary | ICD-10-CM | POA: Diagnosis not present

## 2021-12-18 DIAGNOSIS — M5431 Sciatica, right side: Secondary | ICD-10-CM | POA: Diagnosis not present

## 2021-12-18 DIAGNOSIS — M545 Low back pain, unspecified: Secondary | ICD-10-CM | POA: Diagnosis not present

## 2021-12-25 DIAGNOSIS — M545 Low back pain, unspecified: Secondary | ICD-10-CM | POA: Diagnosis not present

## 2021-12-25 DIAGNOSIS — M5431 Sciatica, right side: Secondary | ICD-10-CM | POA: Diagnosis not present

## 2022-03-06 DIAGNOSIS — H5203 Hypermetropia, bilateral: Secondary | ICD-10-CM | POA: Diagnosis not present

## 2022-03-06 DIAGNOSIS — H11003 Unspecified pterygium of eye, bilateral: Secondary | ICD-10-CM | POA: Diagnosis not present

## 2022-03-06 DIAGNOSIS — H2513 Age-related nuclear cataract, bilateral: Secondary | ICD-10-CM | POA: Diagnosis not present

## 2022-03-06 DIAGNOSIS — H524 Presbyopia: Secondary | ICD-10-CM | POA: Diagnosis not present

## 2022-03-06 DIAGNOSIS — H25013 Cortical age-related cataract, bilateral: Secondary | ICD-10-CM | POA: Diagnosis not present

## 2022-03-06 DIAGNOSIS — H52203 Unspecified astigmatism, bilateral: Secondary | ICD-10-CM | POA: Diagnosis not present

## 2022-03-28 ENCOUNTER — Encounter (HOSPITAL_COMMUNITY): Payer: Self-pay

## 2022-03-28 ENCOUNTER — Other Ambulatory Visit: Payer: Self-pay

## 2022-03-28 ENCOUNTER — Emergency Department (HOSPITAL_COMMUNITY)
Admission: EM | Admit: 2022-03-28 | Discharge: 2022-03-28 | Disposition: A | Discharge status: Home / Self Care | Payer: BC Managed Care – PPO | Attending: Emergency Medicine | Admitting: Emergency Medicine

## 2022-03-28 DIAGNOSIS — M5431 Sciatica, right side: Secondary | ICD-10-CM

## 2022-03-28 DIAGNOSIS — M545 Low back pain, unspecified: Secondary | ICD-10-CM | POA: Diagnosis not present

## 2022-03-28 DIAGNOSIS — M5441 Lumbago with sciatica, right side: Secondary | ICD-10-CM | POA: Diagnosis not present

## 2022-03-28 DIAGNOSIS — Y92410 Unspecified street and highway as the place of occurrence of the external cause: Secondary | ICD-10-CM | POA: Insufficient documentation

## 2022-03-28 DIAGNOSIS — R519 Headache, unspecified: Secondary | ICD-10-CM | POA: Diagnosis not present

## 2022-03-28 MED ORDER — METHYLPREDNISOLONE 4 MG PO TBPK: ORAL_TABLET | ORAL | 0 refills | Status: AC

## 2022-03-28 MED ORDER — METHOCARBAMOL 500 MG PO TABS: 500.0000 mg | ORAL_TABLET | Freq: Three times a day (TID) | ORAL | 0 refills | Status: AC | PRN

## 2022-03-28 MED ORDER — NAPROXEN 375 MG PO TABS: 375.0000 mg | ORAL_TABLET | Freq: Two times a day (BID) | ORAL | 0 refills | Status: AC

## 2022-03-28 NOTE — Discharge Instructions (Signed)
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
## 2022-06-25 IMAGING — CR DG KNEE 3 VIEWS*L*
3 series · 3 of 3 positions shown · non-contrast
Comparison: None.

CLINICAL DATA: Chronic bilateral knee pain and bowing.

EXAM:
LEFT KNEE - 3 VIEW

[w knee ap left *]
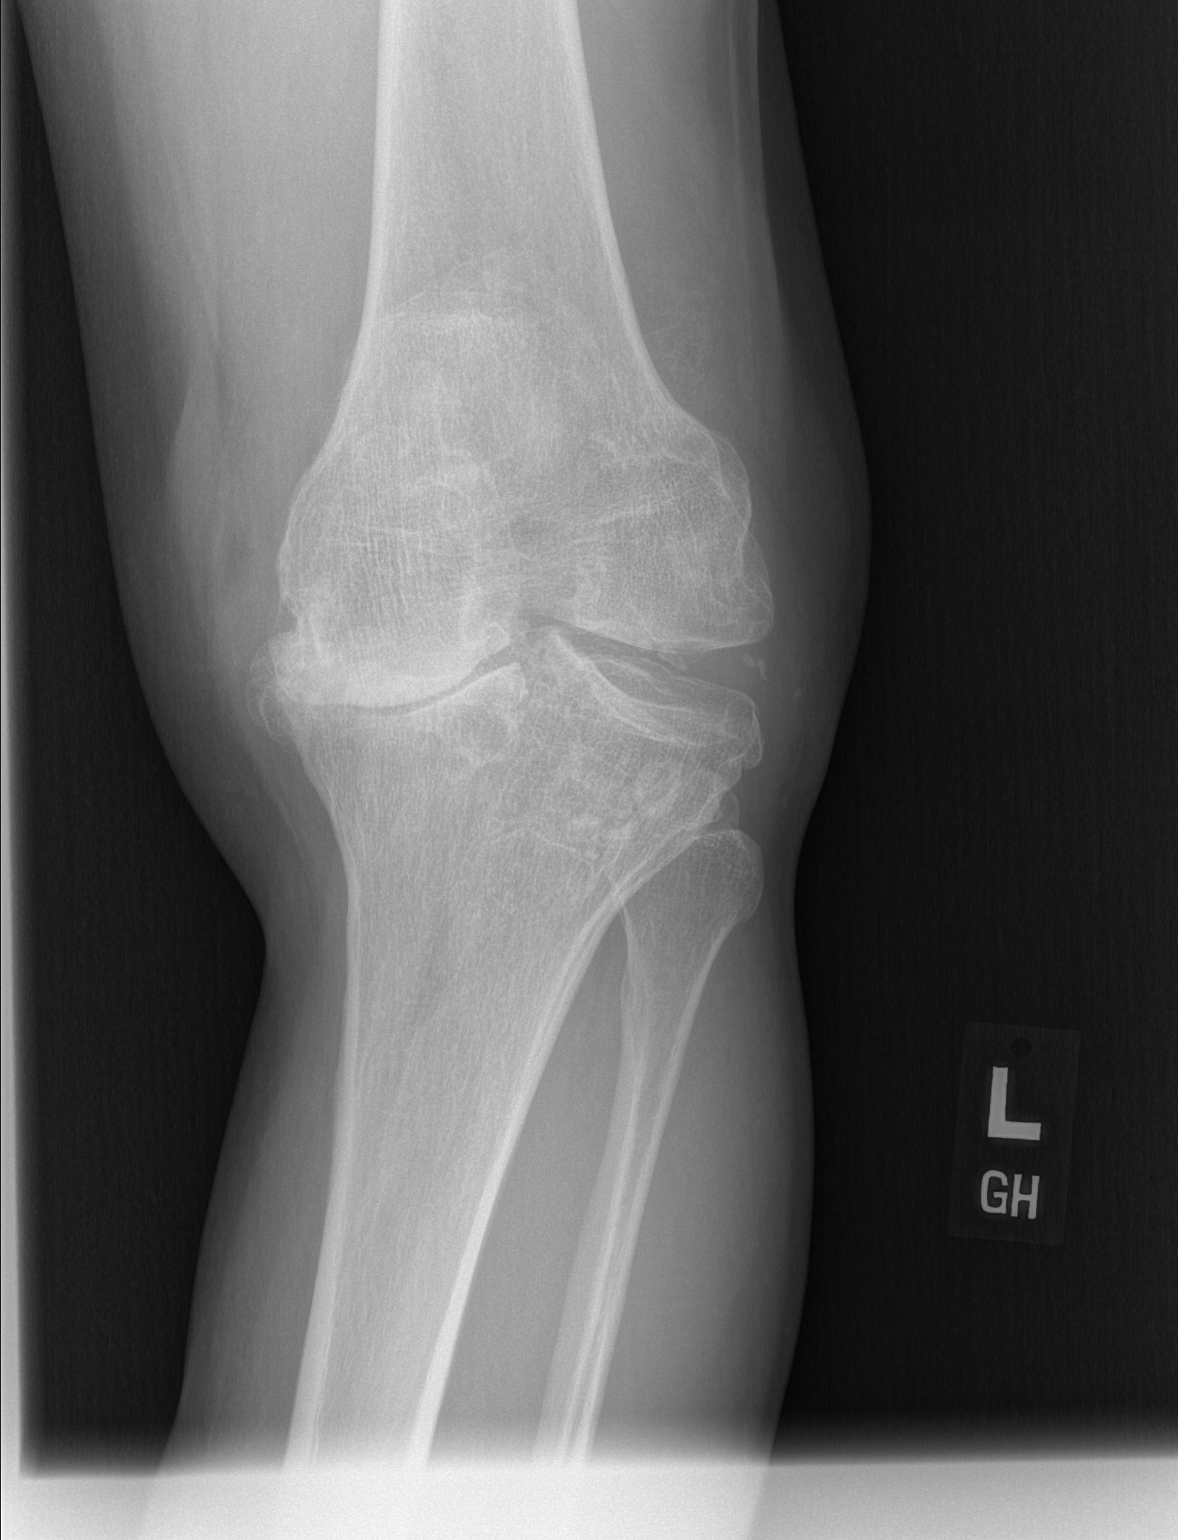

[w knee lat. left *]
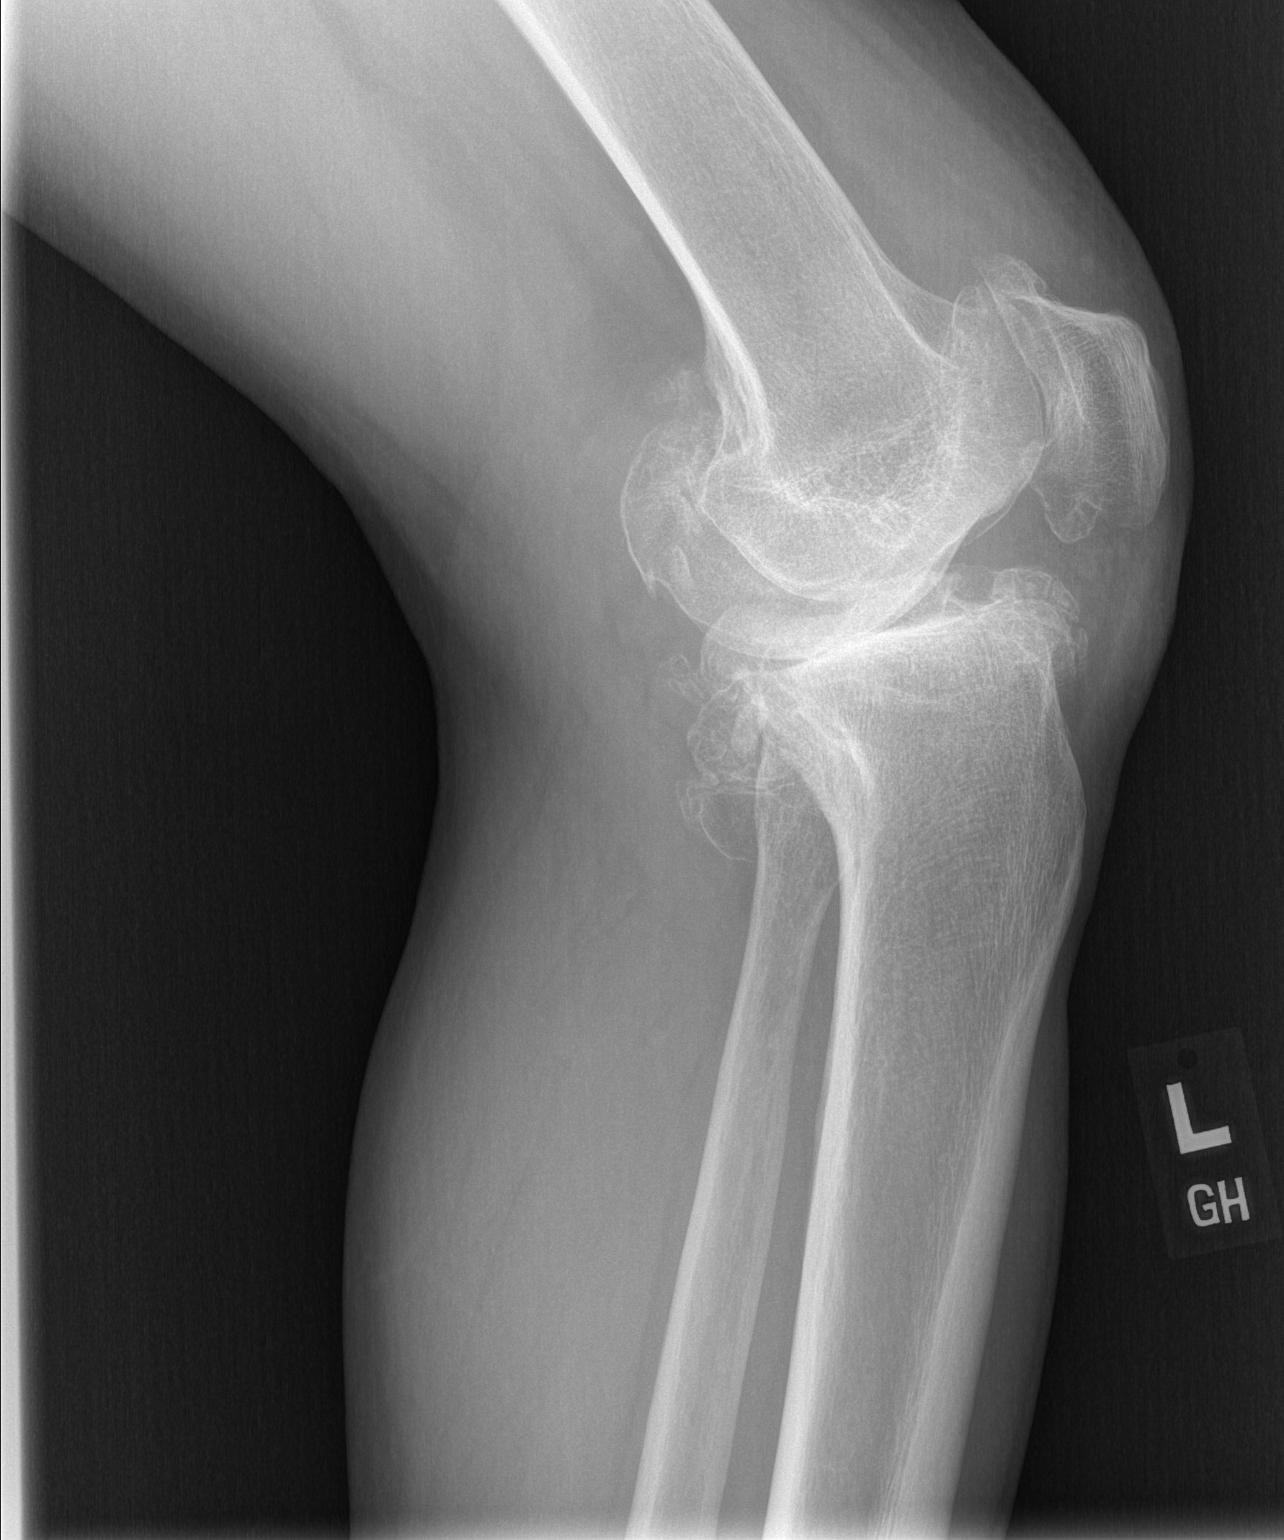

[t patella  left]
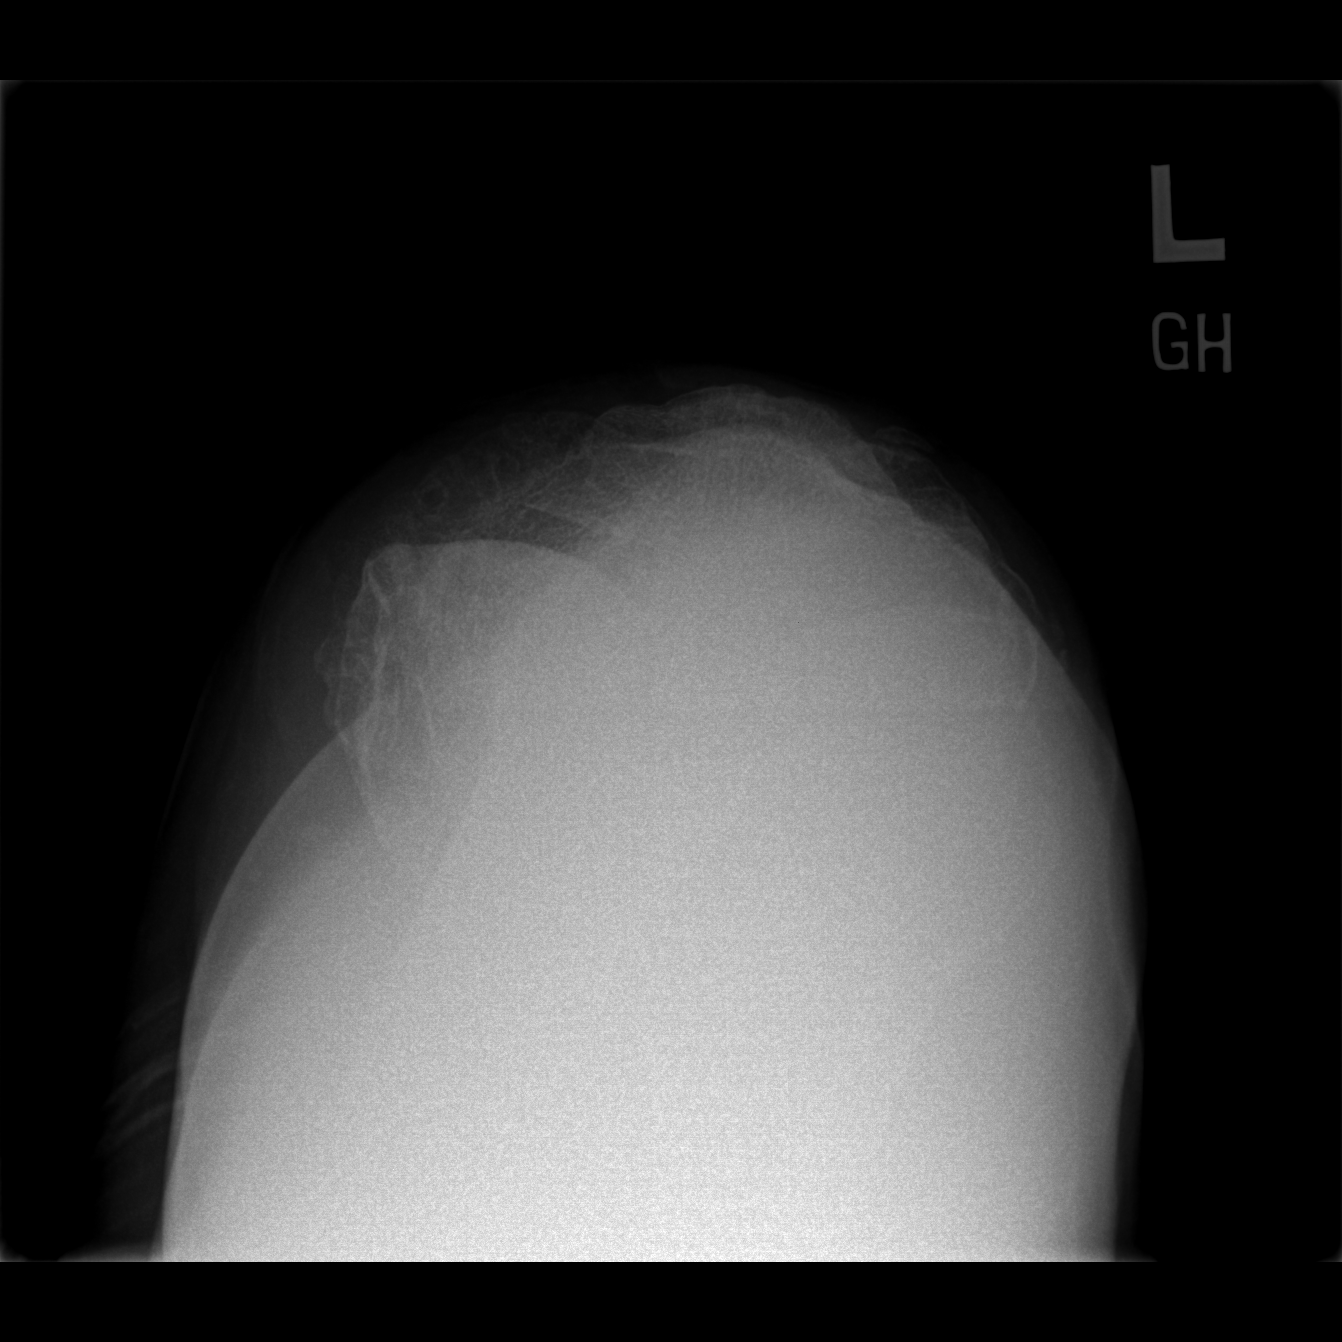

[3 of 3 positions shown; findings below may reference images not displayed]

FINDINGS: Tricompartment degenerative changes in the left knee with severe
asymmetric narrowing of the medial compartment resulting in bone on
bone configuration and varus angulation. Prominent osteophyte
formation in all 3 compartments. Scattered calcifications seen in
the lateral compartment may represent loose bodies. No significant
effusions. No acute fracture or dislocation.
IMPRESSION: Severe degenerative changes in the left knee.

## 2022-09-28 ENCOUNTER — Other Ambulatory Visit: Payer: Self-pay | Admitting: Gastroenterology

## 2022-09-28 DIAGNOSIS — B181 Chronic viral hepatitis B without delta-agent: Secondary | ICD-10-CM

## 2022-10-02 DIAGNOSIS — B181 Chronic viral hepatitis B without delta-agent: Secondary | ICD-10-CM | POA: Diagnosis not present

## 2022-10-08 ENCOUNTER — Ambulatory Visit
Admission: RE | Admit: 2022-10-08 | Discharge: 2022-10-08 | Disposition: A | Discharge status: Home / Self Care | Payer: BC Managed Care – PPO | Source: Ambulatory Visit | Attending: Gastroenterology | Admitting: Gastroenterology

## 2022-10-08 DIAGNOSIS — B181 Chronic viral hepatitis B without delta-agent: Secondary | ICD-10-CM

## 2022-10-08 DIAGNOSIS — B1910 Unspecified viral hepatitis B without hepatic coma: Secondary | ICD-10-CM | POA: Diagnosis not present

## 2022-11-13 DIAGNOSIS — B181 Chronic viral hepatitis B without delta-agent: Secondary | ICD-10-CM | POA: Diagnosis not present

## 2022-12-03 DIAGNOSIS — Z13220 Encounter for screening for lipoid disorders: Secondary | ICD-10-CM | POA: Diagnosis not present

## 2022-12-03 DIAGNOSIS — Z125 Encounter for screening for malignant neoplasm of prostate: Secondary | ICD-10-CM | POA: Diagnosis not present

## 2022-12-03 DIAGNOSIS — Z0000 Encounter for general adult medical examination without abnormal findings: Secondary | ICD-10-CM | POA: Diagnosis not present

## 2023-01-27 IMAGING — US US ABDOMEN LIMITED
1 series · 14 of 25 positions shown · non-contrast
Comparison: None Available.

CLINICAL DATA: History of hepatitis-B.

EXAM:
ULTRASOUND ABDOMEN LIMITED RIGHT UPPER QUADRANT

[Series 1: us abdomen limited · 0.22mm/px · 14 of 43 slices shown]
[im 1/43]
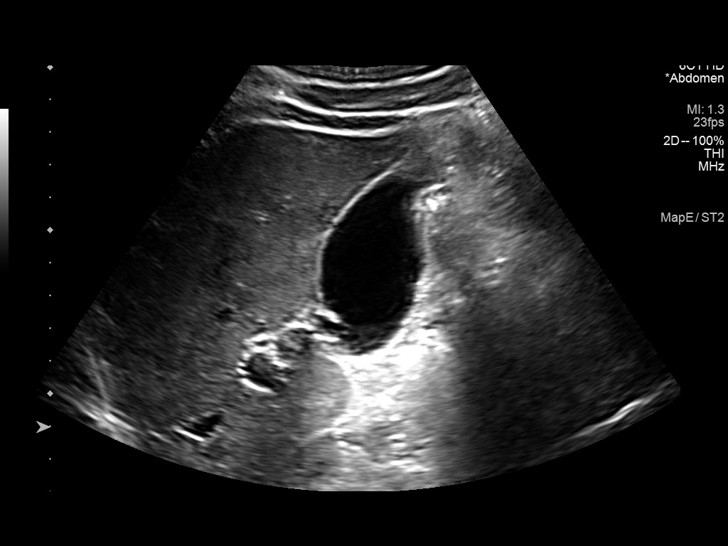
[im 4/43]
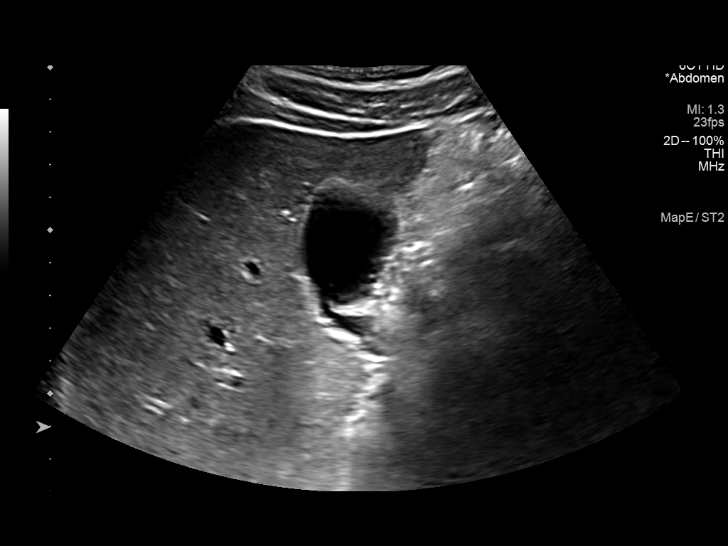
[im 8/43]
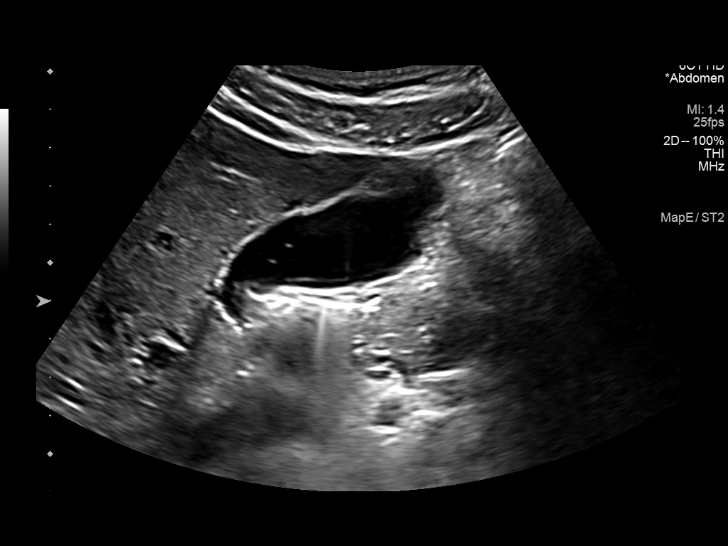
[im 11/43]
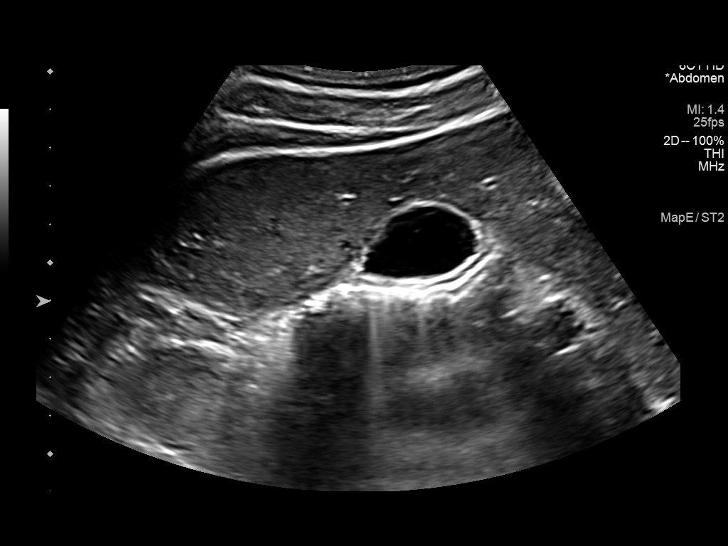
[im 15/43]
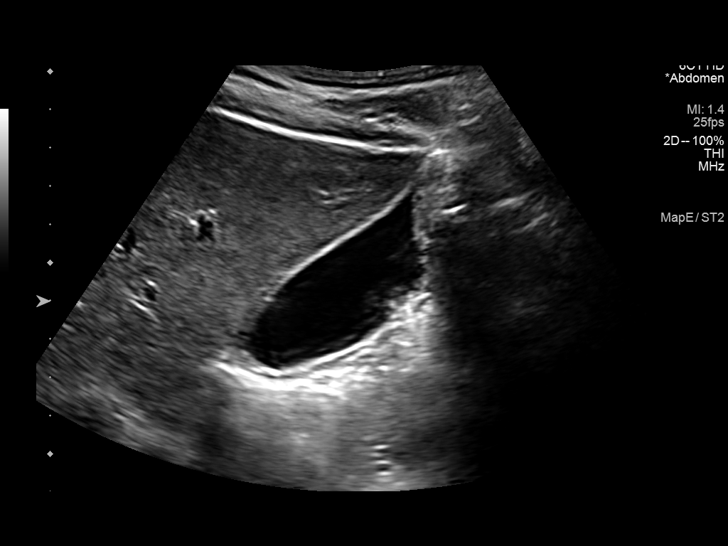
[im 16/43]
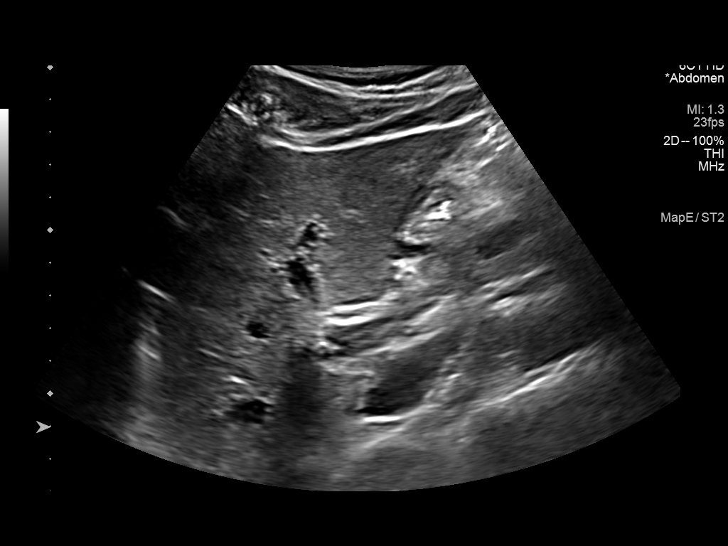
[im 20/43]
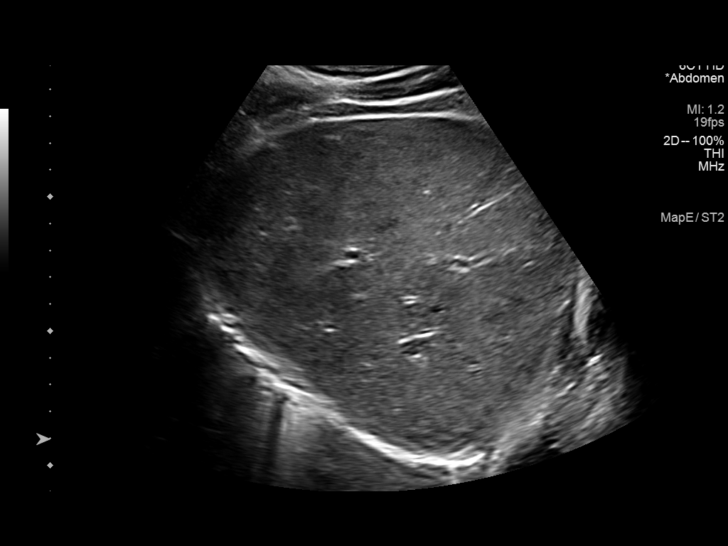
[im 23/43]
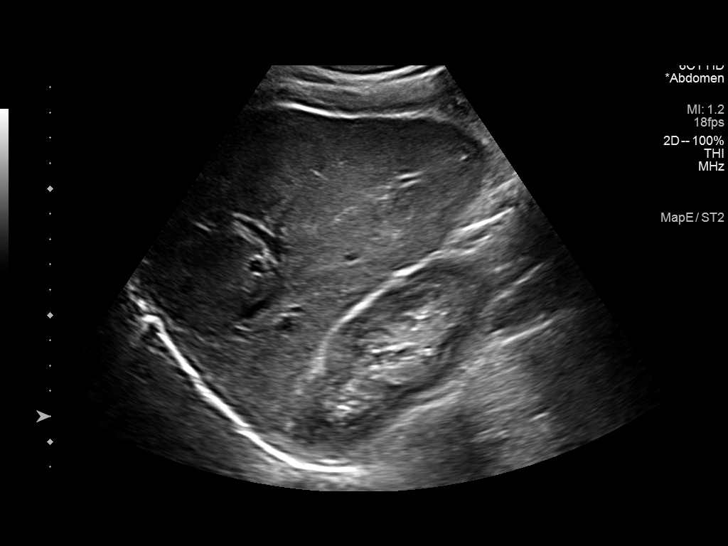
[im 27/43]
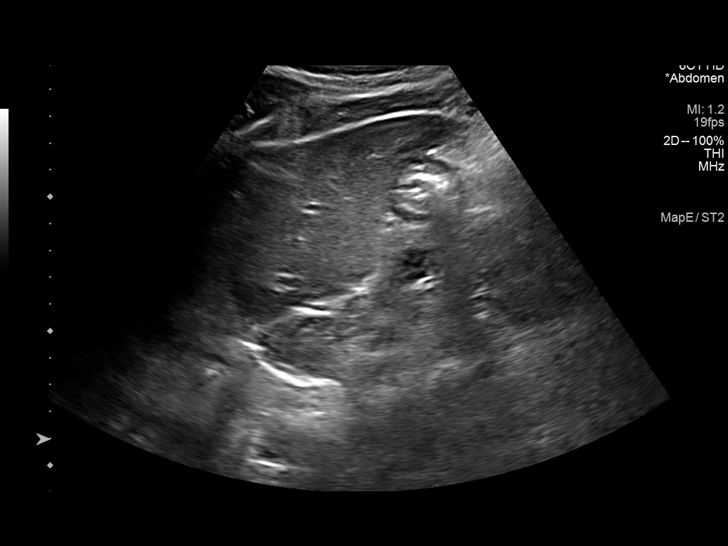
[im 29/43]
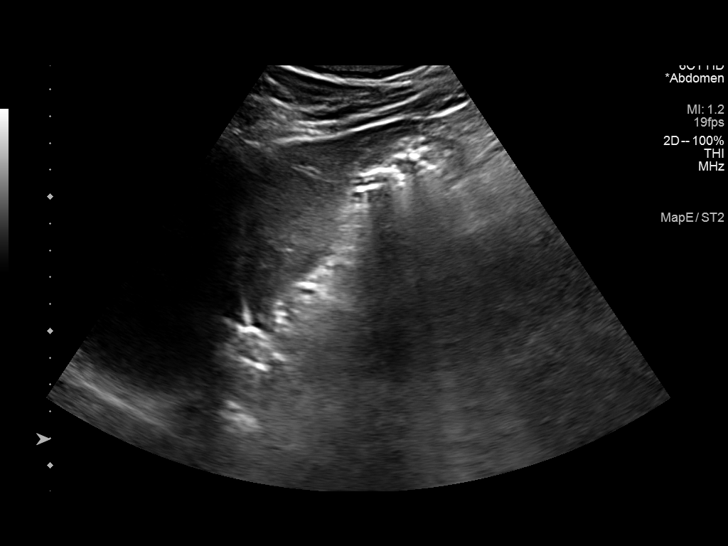
[im 32/43]
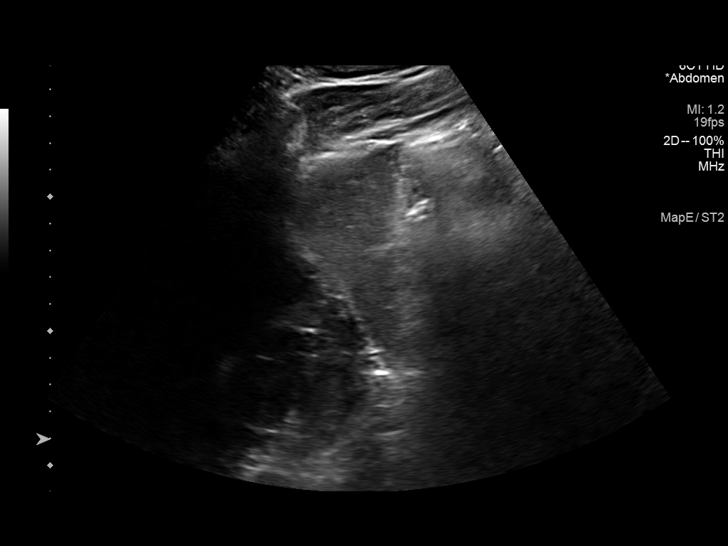
[im 36/43]
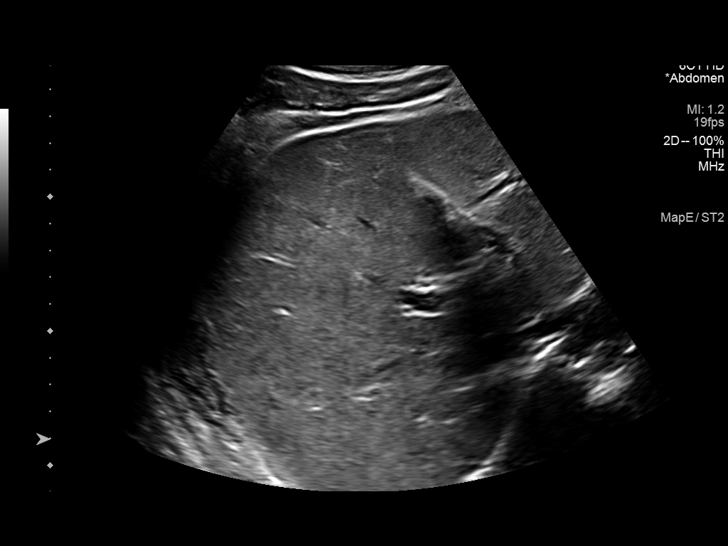
[im 39/43]
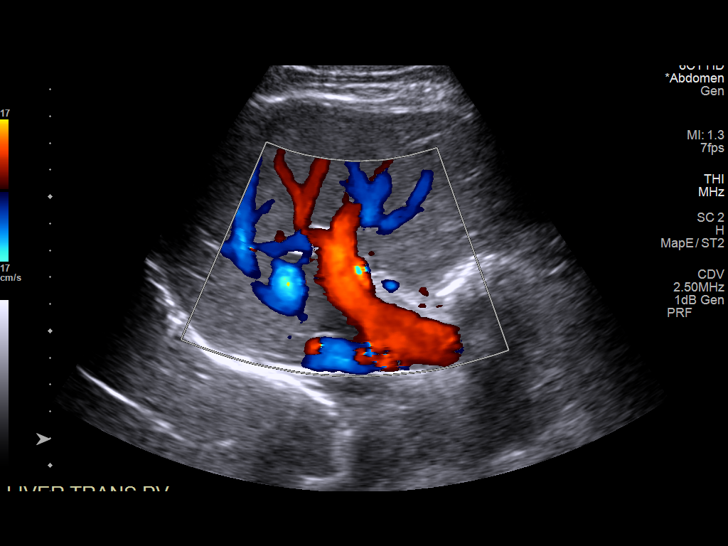
[im 43/43]
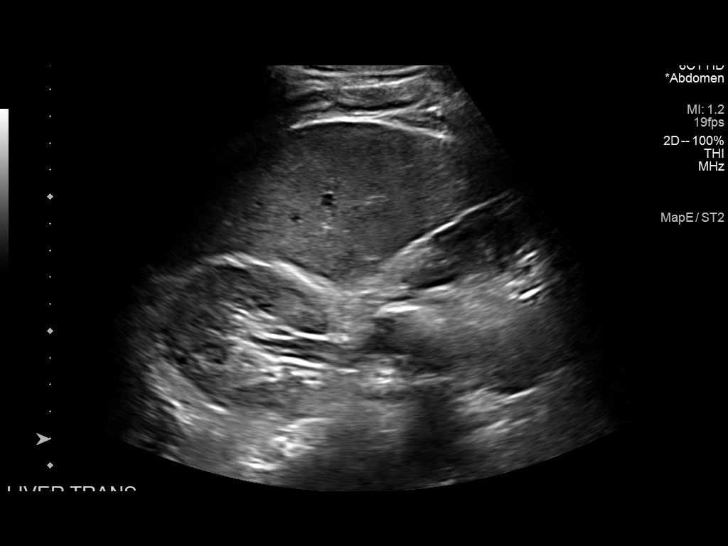

[14 of 25 positions shown; findings below may reference images not displayed]

FINDINGS: Gallbladder:

No gallstones or wall thickening visualized. No sonographic Murphy
sign noted by sonographer.

Common bile duct:

Diameter: 2 mm

Liver:

No focal lesion identified. Within normal limits in parenchymal
echogenicity. Portal vein is patent on color Doppler imaging with
normal direction of blood flow towards the liver.

Other: None.
IMPRESSION: No cholelithiasis or sonographic evidence for acute cholecystitis.

## 2023-03-12 DIAGNOSIS — H11003 Unspecified pterygium of eye, bilateral: Secondary | ICD-10-CM | POA: Diagnosis not present

## 2023-03-12 DIAGNOSIS — H2513 Age-related nuclear cataract, bilateral: Secondary | ICD-10-CM | POA: Diagnosis not present

## 2023-03-12 DIAGNOSIS — H25013 Cortical age-related cataract, bilateral: Secondary | ICD-10-CM | POA: Diagnosis not present

## 2023-03-12 DIAGNOSIS — H524 Presbyopia: Secondary | ICD-10-CM | POA: Diagnosis not present

## 2023-03-12 DIAGNOSIS — H5203 Hypermetropia, bilateral: Secondary | ICD-10-CM | POA: Diagnosis not present

## 2023-05-14 DIAGNOSIS — B181 Chronic viral hepatitis B without delta-agent: Secondary | ICD-10-CM | POA: Diagnosis not present

## 2023-11-14 ENCOUNTER — Other Ambulatory Visit: Payer: Self-pay | Admitting: Gastroenterology

## 2023-11-14 DIAGNOSIS — B181 Chronic viral hepatitis B without delta-agent: Secondary | ICD-10-CM

## 2023-11-20 ENCOUNTER — Ambulatory Visit
Admission: RE | Admit: 2023-11-20 | Discharge: 2023-11-20 | Disposition: A | Source: Ambulatory Visit | Attending: Gastroenterology

## 2023-11-20 DIAGNOSIS — B181 Chronic viral hepatitis B without delta-agent: Secondary | ICD-10-CM

## 2023-12-18 DIAGNOSIS — Z6827 Body mass index (BMI) 27.0-27.9, adult: Secondary | ICD-10-CM | POA: Diagnosis not present

## 2023-12-18 DIAGNOSIS — M79673 Pain in unspecified foot: Secondary | ICD-10-CM | POA: Diagnosis not present

## 2023-12-18 DIAGNOSIS — Z0000 Encounter for general adult medical examination without abnormal findings: Secondary | ICD-10-CM | POA: Diagnosis not present

## 2023-12-25 ENCOUNTER — Ambulatory Visit (INDEPENDENT_AMBULATORY_CARE_PROVIDER_SITE_OTHER)

## 2023-12-25 ENCOUNTER — Other Ambulatory Visit: Payer: Self-pay | Admitting: Podiatry

## 2023-12-25 ENCOUNTER — Ambulatory Visit: Admitting: Podiatry

## 2023-12-25 DIAGNOSIS — M79671 Pain in right foot: Secondary | ICD-10-CM

## 2023-12-25 DIAGNOSIS — Q666 Other congenital valgus deformities of feet: Secondary | ICD-10-CM

## 2023-12-25 DIAGNOSIS — M19071 Primary osteoarthritis, right ankle and foot: Secondary | ICD-10-CM | POA: Diagnosis not present

## 2023-12-25 NOTE — Progress Notes (Signed)
 Subjective:  Patient ID: Javier Weaver, male    DOB: Feb 28, 1965,  MRN: [id number]  Chief Complaint  Patient presents with   Foot Pain    Right foot pain    58 y.o. male presents with the above complaint.  Patient presents with right dorsal midfoot pain it is very mild in nature he states that it just came out of nowhere wanted to get it evaluated has not seen and was prior to seeing me.  He does not wear any orthotics he would like to discuss shoe gear modification as well.  He does not want to steroid injection for now as the pain is gone really mild.  Denies any other acute complaints   Review of Systems: Negative except as noted in the HPI. Denies N/V/F/Ch.  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    LEFT    Current Outpatient Medications:    gabapentin (NEURONTIN) 300 MG capsule, Take 300 mg by mouth daily as needed for pain., Disp: , Rfl:    meloxicam (MOBIC) 15 MG tablet, Take 15 mg by mouth daily as needed for pain., Disp: , Rfl:    methocarbamol  (ROBAXIN ) 500 MG tablet, Take 1 tablet (500 mg total) by mouth 3 (three) times daily as needed for muscle spasms., Disp: 21 tablet, Rfl: 0   methylPREDNISolone  (MEDROL  DOSEPAK) 4 MG TBPK tablet, Use as directed, Disp: 21 tablet, Rfl: 0   naproxen  (NAPROSYN ) 375 MG tablet, Take 1 tablet (375 mg total) by mouth 2 (two) times daily with a meal., Disp: 20 tablet, Rfl: 0  Social History   Tobacco Use  Smoking Status Never  Smokeless Tobacco Never    No Known Allergies Objective:  There were no vitals filed for this visit. There is no height or weight on file to calculate BMI. Constitutional Well developed. Well nourished.  Vascular Dorsalis pedis pulses palpable bilaterally. Posterior tibial pulses palpable bilaterally. Capillary refill normal to all digits.  No cyanosis or clubbing noted. Pedal hair growth normal.  Neurologic Normal speech. Oriented to person, place, and time. Epicritic sensation to  light touch grossly present bilaterally.  Dermatologic Nails well groomed and normal in appearance. No open wounds. No skin lesions.  Orthopedic: Pain on palpation right dorsal midfoot clinically able to appreciate some underlying arthritis.  Negative Lisfranc injury.  Negative extensor tendinitis   Radiographs: 3 views skeletally mature the right foot: Midfoot arthritis noted pes planovalgus foot structure noted no other bony abnormalities identified.  No fractures noted Assessment:   1. Arthritis of right midfoot   2. Pes planovalgus    Plan:  Patient was evaluated and treated and all questions answered.  Right foot midfoot arthritis mild - All questions and concerns were discussed with the patient in extensive detail given this is very mild in nature we will hold off on steroid injection however if there is no improvement we will discuss steroid injection in the near future I discussed shoe gear modification orthotics.  Pes planovalgus/foot deformity -I explained to patient the etiology of pes planovalgus and relationship with heel pain/arch pain and various treatment options were discussed.  Given patient foot structure in the setting of heel pain/arch pain I believe patient will benefit from custom-made orthotics to help control the hindfoot motion support the arch of the foot and take the stress away from arches.  Patient agrees with the plan like to proceed with orthotics -Patient was casted for orthotics   No follow-ups on file.
